# Patient Record
Sex: Male | Born: 1980 | Race: Black or African American | Hispanic: No | Marital: Single | Smoking: Never smoker
Health system: Southern US, Community
[De-identification: ages and names within clinical notes are randomized; demographics above are authoritative.]

## PROBLEM LIST (undated history)

## (undated) DIAGNOSIS — I10 Essential (primary) hypertension: Secondary | ICD-10-CM

---
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
## 2002-03-06 ENCOUNTER — Emergency Department (HOSPITAL_COMMUNITY): Admission: EM | Admit: 2002-03-06 | Discharge: 2002-03-06 | Payer: Self-pay | Admitting: Unknown Physician Specialty

## 2003-09-04 ENCOUNTER — Emergency Department (HOSPITAL_COMMUNITY): Admission: EM | Admit: 2003-09-04 | Discharge: 2003-09-04 | Payer: Self-pay | Admitting: Emergency Medicine

## 2003-11-02 ENCOUNTER — Emergency Department (HOSPITAL_COMMUNITY): Admission: EM | Admit: 2003-11-02 | Discharge: 2003-11-02 | Payer: Self-pay | Admitting: Emergency Medicine

## 2003-11-04 ENCOUNTER — Emergency Department (HOSPITAL_COMMUNITY): Admission: EM | Admit: 2003-11-04 | Discharge: 2003-11-04 | Payer: Self-pay | Admitting: Family Medicine

## 2007-04-29 ENCOUNTER — Emergency Department (HOSPITAL_COMMUNITY): Admission: EM | Admit: 2007-04-29 | Discharge: 2007-04-29 | Payer: Self-pay | Admitting: Emergency Medicine

## 2007-05-09 ENCOUNTER — Emergency Department (HOSPITAL_COMMUNITY): Admission: EM | Admit: 2007-05-09 | Discharge: 2007-05-09 | Payer: Self-pay | Admitting: Emergency Medicine

## 2007-05-16 ENCOUNTER — Emergency Department (HOSPITAL_COMMUNITY): Admission: EM | Admit: 2007-05-16 | Discharge: 2007-05-16 | Payer: Self-pay | Admitting: Emergency Medicine

## 2007-12-21 ENCOUNTER — Emergency Department (HOSPITAL_COMMUNITY): Admission: EM | Admit: 2007-12-21 | Discharge: 2007-12-21 | Payer: Self-pay | Admitting: Emergency Medicine

## 2008-08-09 ENCOUNTER — Emergency Department (HOSPITAL_COMMUNITY): Admission: EM | Admit: 2008-08-09 | Discharge: 2008-08-09 | Payer: Self-pay | Admitting: Emergency Medicine

## 2008-08-14 ENCOUNTER — Emergency Department (HOSPITAL_COMMUNITY): Admission: EM | Admit: 2008-08-14 | Discharge: 2008-08-14 | Payer: Self-pay | Admitting: Emergency Medicine

## 2008-08-23 ENCOUNTER — Emergency Department (HOSPITAL_COMMUNITY): Admission: EM | Admit: 2008-08-23 | Discharge: 2008-08-23 | Payer: Self-pay | Admitting: Emergency Medicine

## 2008-08-30 ENCOUNTER — Emergency Department (HOSPITAL_COMMUNITY): Admission: EM | Admit: 2008-08-30 | Discharge: 2008-08-30 | Payer: Self-pay | Admitting: Emergency Medicine

## 2010-09-16 ENCOUNTER — Emergency Department (HOSPITAL_COMMUNITY)
Admission: EM | Admit: 2010-09-16 | Discharge: 2010-09-16 | Disposition: A | Discharge status: Home / Self Care | Payer: No Typology Code available for payment source | Attending: Emergency Medicine | Admitting: Emergency Medicine

## 2010-09-16 DIAGNOSIS — R011 Cardiac murmur, unspecified: Secondary | ICD-10-CM | POA: Insufficient documentation

## 2010-09-16 DIAGNOSIS — M549 Dorsalgia, unspecified: Secondary | ICD-10-CM | POA: Insufficient documentation

## 2010-09-16 DIAGNOSIS — I10 Essential (primary) hypertension: Secondary | ICD-10-CM | POA: Insufficient documentation

## 2010-09-16 DIAGNOSIS — F319 Bipolar disorder, unspecified: Secondary | ICD-10-CM | POA: Insufficient documentation

## 2010-10-26 LAB — POCT I-STAT, CHEM 8
Calcium, Ion: 1.03 mmol/L — ABNORMAL LOW (ref 1.12–1.32)
HCT: 47 % (ref 39.0–52.0)
Hemoglobin: 16 g/dL (ref 13.0–17.0)
Sodium: 137 mEq/L (ref 135–145)
TCO2: 20 mmol/L (ref 0–100)
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
## 2010-11-27 NOTE — Discharge Summary (Signed)
[facility]  Patient:    Theodore Perez, Theodore Perez                      MRN: [id number] Adm. Date:  [id number] Disc. Date: 04/04/00 Attending:  Veneta Penton                           Discharge Summary  REASON FOR ADMISSION:  This 30 year old black male was admitted involuntarily after threatening to kill himself.  For further history of present illness, please see the patients psychiatric admission assessment.  PHYSICAL EXAMINATION:  His physical examination at the time of admission was entirely unremarkable.  LABORATORY EXAMINATION:  The patients laboratory examination was significant for a urine drug screen positive for marijuana.  A hepatic panel was unremarkable.  A UA was unremarkable.  CBC was within normal limits.  A metabolic panel was within normal limits.  GGT was 27.  Thyroid function tests are pending at the time of discharge.  The patient received no x-rays, special procedures, or consultations during the course of this hospitalization.  He sustained no complications during the course of his hospitalization.  HOSPITAL COURSE:  The patient rapidly adapted to unit routine, socializing well with both patients and staff.  He remained somewhat oppositional and defiant.  He was begun on a trial of Effexor XR and titrated up to a therapeutic dose.  He has had no side effects from the medication.  His affect and mood have improved.  He no longer appears to be depressed or irritable as he did on admission.  His concentration has also improved.  He has shown some improvement in impulse control.  He denies any homicidal or suicidal ideation. He is participating in all aspects of the therapeutic treatment program. Consequently it is felt that the patient has reached his maximum benefits of hospitalization and is ready for transition to a less restrictive alternative setting.  CONDITION ON DISCHARGE:  Improved.  DIAGNOSES: Axis I:    1. Major  depression, recurrent type, severe, without psychosis.            2. Conduct disorder.            3. Attention deficit hyperactivity disorder, combined type.            4. Cannabis abuse. Axis II:   Rule out personality disorder, not otherwise specified. Axis III:  None. Axis IV:   Severe. Axis V:    Code 10.  FURTHER EVALUATION AND TREATMENT RECOMMENDATIONS: 1. Patient is discharged to home. 2. Patient is discharged on unrestricted level of activity and a regular    diet. 3. The patient is discharged to outpatient, individual, and family [facility]    [facility], along with following up with his    outpatient psychiatrist.  Consequently I will sign off on the case at this    time.  MEDICATIONS:  The patient is discharged on Effexor XR 75 mg p.o. q.a.m. DD:  04/04/00 TD:  04/04/00 Job: [id number]

## 2011-02-10 ENCOUNTER — Emergency Department (HOSPITAL_COMMUNITY)
Admission: EM | Admit: 2011-02-10 | Discharge: 2011-02-10 | Disposition: A | Discharge status: Home / Self Care | Payer: Self-pay | Attending: Emergency Medicine | Admitting: Emergency Medicine

## 2011-02-10 DIAGNOSIS — L989 Disorder of the skin and subcutaneous tissue, unspecified: Secondary | ICD-10-CM | POA: Insufficient documentation

## 2011-02-10 DIAGNOSIS — I10 Essential (primary) hypertension: Secondary | ICD-10-CM | POA: Insufficient documentation

## 2011-02-10 DIAGNOSIS — M79609 Pain in unspecified limb: Secondary | ICD-10-CM | POA: Insufficient documentation

## 2011-02-10 DIAGNOSIS — R634 Abnormal weight loss: Secondary | ICD-10-CM | POA: Insufficient documentation

## 2011-02-10 DIAGNOSIS — R197 Diarrhea, unspecified: Secondary | ICD-10-CM | POA: Insufficient documentation

## 2011-02-10 DIAGNOSIS — G8929 Other chronic pain: Secondary | ICD-10-CM | POA: Insufficient documentation

## 2011-02-10 DIAGNOSIS — R209 Unspecified disturbances of skin sensation: Secondary | ICD-10-CM | POA: Insufficient documentation

## 2011-02-10 DIAGNOSIS — F319 Bipolar disorder, unspecified: Secondary | ICD-10-CM | POA: Insufficient documentation

## 2011-02-10 DIAGNOSIS — M549 Dorsalgia, unspecified: Secondary | ICD-10-CM | POA: Insufficient documentation

## 2011-02-10 LAB — DIFFERENTIAL
Basophils Absolute: 0 10*3/uL (ref 0.0–0.1)
Basophils Relative: 0 % (ref 0–1)
Lymphocytes Relative: 27 % (ref 12–46)
Lymphs Abs: 2.5 10*3/uL (ref 0.7–4.0)
Neutro Abs: 5.6 10*3/uL (ref 1.7–7.7)
Neutrophils Relative %: 60 % (ref 43–77)

## 2011-02-10 LAB — CBC
MCH: 28.6 pg (ref 26.0–34.0)
MCHC: 33.6 g/dL (ref 30.0–36.0)
MCV: 85.3 fL (ref 78.0–100.0)
Platelets: 233 10*3/uL (ref 150–400)
RDW: 13.7 % (ref 11.5–15.5)
WBC: 9.3 10*3/uL (ref 4.0–10.5)

## 2011-02-10 LAB — BASIC METABOLIC PANEL
BUN: 13 mg/dL (ref 6–23)
CO2: 29 mEq/L (ref 19–32)
Potassium: 4 mEq/L (ref 3.5–5.1)
Sodium: 139 mEq/L (ref 135–145)

## 2011-02-11 LAB — GIARDIA/CRYPTOSPORIDIUM SCREEN(EIA): Cryptosporidium Screen (EIA): NEGATIVE

## 2011-02-17 ENCOUNTER — Emergency Department (HOSPITAL_COMMUNITY)
Admission: EM | Admit: 2011-02-17 | Discharge: 2011-02-17 | Disposition: A | Discharge status: Home / Self Care | Payer: Self-pay | Attending: Emergency Medicine | Admitting: Emergency Medicine

## 2011-02-17 DIAGNOSIS — Z23 Encounter for immunization: Secondary | ICD-10-CM | POA: Insufficient documentation

## 2011-03-19 ENCOUNTER — Inpatient Hospital Stay (INDEPENDENT_AMBULATORY_CARE_PROVIDER_SITE_OTHER)
Admission: RE | Admit: 2011-03-19 | Discharge: 2011-03-19 | Disposition: A | Discharge status: Home / Self Care | Payer: Self-pay | Source: Ambulatory Visit | Attending: Family Medicine | Admitting: Family Medicine

## 2011-03-19 DIAGNOSIS — N342 Other urethritis: Secondary | ICD-10-CM

## 2011-03-20 LAB — GC/CHLAMYDIA PROBE AMP, GENITAL
Chlamydia, DNA Probe: NEGATIVE
GC Probe Amp, Genital: NEGATIVE

## 2012-02-12 ENCOUNTER — Emergency Department (HOSPITAL_COMMUNITY)
Admission: EM | Admit: 2012-02-12 | Discharge: 2012-02-12 | Disposition: A | Discharge status: Home / Self Care | Payer: Medicare Other | Attending: Emergency Medicine | Admitting: Emergency Medicine

## 2012-02-12 ENCOUNTER — Encounter (HOSPITAL_COMMUNITY): Payer: Self-pay | Admitting: *Deleted

## 2012-02-12 DIAGNOSIS — R070 Pain in throat: Secondary | ICD-10-CM | POA: Insufficient documentation

## 2012-02-12 DIAGNOSIS — B9789 Other viral agents as the cause of diseases classified elsewhere: Secondary | ICD-10-CM | POA: Insufficient documentation

## 2012-02-12 DIAGNOSIS — I889 Nonspecific lymphadenitis, unspecified: Secondary | ICD-10-CM

## 2012-02-12 DIAGNOSIS — J028 Acute pharyngitis due to other specified organisms: Secondary | ICD-10-CM

## 2012-02-12 MED ORDER — IBUPROFEN 200 MG PO TABS
600.0000 mg | ORAL_TABLET | Freq: Once | ORAL | Status: AC
  Administered 2012-02-12: 600 mg via ORAL
  Filled 2012-02-12: qty 3

## 2012-02-12 NOTE — ED Notes (Signed)
Pt felt sinus pressure and general malaise on Wed.  Fri am he felt swelling and pain 6/10 to L neck.  PT afebrile.

## 2012-02-12 NOTE — ED Notes (Signed)
Pt reports sore throat, sinus pressure, and weakness x4 days, increase pain w/swallowing
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
## 2012-02-13 NOTE — ED Provider Notes (Signed)
Medical screening examination/treatment/procedure(s) were performed by non-physician practitioner and as supervising physician I was immediately available for consultation/collaboration.  Angy Swearengin, MD 02/13/12 1506 

## 2012-09-22 ENCOUNTER — Emergency Department (INDEPENDENT_AMBULATORY_CARE_PROVIDER_SITE_OTHER): Payer: Medicaid Other

## 2012-09-22 ENCOUNTER — Encounter (HOSPITAL_COMMUNITY): Payer: Self-pay | Admitting: *Deleted

## 2012-09-22 ENCOUNTER — Emergency Department (HOSPITAL_COMMUNITY)
Admission: EM | Admit: 2012-09-22 | Discharge: 2012-09-22 | Disposition: A | Discharge status: Home / Self Care | Payer: Medicare Other | Attending: Emergency Medicine | Admitting: Emergency Medicine

## 2012-09-22 ENCOUNTER — Emergency Department (HOSPITAL_COMMUNITY)
Admission: EM | Admit: 2012-09-22 | Discharge: 2012-09-22 | Disposition: A | Discharge status: Nursing Home (Includes ICF) | Payer: Medicaid Other | Source: Home / Self Care | Attending: Emergency Medicine | Admitting: Emergency Medicine

## 2012-09-22 DIAGNOSIS — I208 Other forms of angina pectoris: Secondary | ICD-10-CM

## 2012-09-22 DIAGNOSIS — R0789 Other chest pain: Secondary | ICD-10-CM | POA: Insufficient documentation

## 2012-09-22 LAB — CBC
Hemoglobin: 14.9 g/dL (ref 13.0–17.0)
Platelets: 248 10*3/uL (ref 150–400)
RBC: 5.05 MIL/uL (ref 4.22–5.81)
RDW: 13 % (ref 11.5–15.5)
WBC: 9.4 10*3/uL (ref 4.0–10.5)

## 2012-09-22 LAB — BASIC METABOLIC PANEL
CO2: 27 mEq/L (ref 19–32)
Calcium: 9.7 mg/dL (ref 8.4–10.5)
Creatinine, Ser: 1.11 mg/dL (ref 0.50–1.35)
GFR calc Af Amer: >90 mL/min (ref >90)
Glucose, Bld: 93 mg/dL (ref 70–99)
Potassium: 4.3 mEq/L (ref 3.5–5.1)

## 2012-09-22 LAB — POCT I-STAT, CHEM 8
BUN: 14 mg/dL (ref 6–23)
Calcium, Ion: 1.25 mmol/L — ABNORMAL HIGH (ref 1.12–1.23)
Chloride: 104 mEq/L (ref 96–112)
Glucose, Bld: 98 mg/dL (ref 70–99)
HCT: 48 % (ref 39.0–52.0)
Hemoglobin: 16.3 g/dL (ref 13.0–17.0)
Sodium: 139 mEq/L (ref 135–145)
TCO2: 27 mmol/L (ref 0–100)

## 2012-09-22 NOTE — ED Notes (Signed)
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

## 2012-09-22 NOTE — ED Provider Notes (Signed)
Chief Complaint:   Chief Complaint  Patient presents with  . 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 The pain is upper sternal and described as a pressure or tightness rated 1-2/10 in intensity. It does not radiate. It lasts for seconds at a time. It occurs only with exertion such as walking or so she walking up a slight incline. It goes away with rest. The shortness of breath also comes on with exertion and goes away with rest. He's felt dizzy and lightheaded. He denies any diaphoresis, nausea, or vomiting. He did have some abdominal pain and diarrhea last week but now this has gone away. He has a history of a heart murmur as a child but has not been checked for this recently. He denies any coughing, wheezing, palpitations, rapid heartbeat, syncope, or ankle edema. He's had migraine type headaches for years. These typically occur in the morning. They have been little bit more frequent recently. They're not occurring everyday. He also has had hypertension since he was 16. He has not taken any medication in about 13 years. He does not have any history of diabetes, elevated cholesterol, family history of heart disease, or cigarette smoking.  Review of Systems:  Other than noted above, the patient denies any of the following symptoms. Systemic:  No fever, chills, sweats, or fatigue. ENT:  No nasal congestion, rhinorrhea, or sore throat. Pulmonary:  No cough, wheezing, shortness of breath, sputum production, hemoptysis. Cardiac:  No palpitations, rapid heartbeat, dizziness, presyncope or syncope. GI:  No abdominal pain, heartburn, nausea, or vomiting. Ext:  No leg pain or swelling.  PMFSH:  Past medical history, family history, social history, meds, and allergies were reviewed and updated as needed. He is allergic to aspirin and penicillin. He takes risperidone  Zoloft. Medical problems include high blood pressure, migraines, OCD, and ADD. He does not use alcohol or tobacco.  Physical Exam:   Vital signs:  BP 159/99  Pulse 70  Temp(Src) 98.9 F (37.2 C) (Oral)  Resp 20  SpO2 100% Gen:  Alert, oriented, in no distress, skin warm and dry. Eye:  PERRL, lids and conjunctivas normal.  Sclera non-icteric. ENT:  Mucous membranes moist, pharynx clear. Neck:  Supple, no adenopathy or tenderness.  No JVD. Lungs:  Clear to auscultation, no wheezes, rales or rhonchi.  No respiratory distress. Heart:  Regular rhythm.  No gallops, murmers, clicks or rubs. Chest:  No chest wall tenderness. Abdomen:  Soft, nontender, no organomegaly or mass.  Bowel sounds normal.  No pulsatile abdominal mass or bruit. Ext:  No edema.  No calf tenderness and Homann's sign negative.  Pulses full and equal. Skin:  Warm and dry.  No rash.  Labs:   Results for orders placed during the hospital encounter of 09/22/12  POCT I-STAT, CHEM 8      Result Value Perez   Sodium 139  135 - 145 mEq/L   Potassium 4.2  3.5 - 5.1 mEq/L   Chloride 104  96 - 112 mEq/L   BUN 14  6 - 23 mg/dL   Creatinine, Ser 1.61  0.50 - 1.35 mg/dL   Glucose, Bld 98  70 - 99 mg/dL   Calcium, Ion 0.96 (*) 1.12 - 1.23 mmol/L   TCO2 27  0 - 100 mmol/L   Hemoglobin 16.3  13.0 - 17.0 g/dL  HCT 48.0  39.0 - 52.0 %     Radiology:  Dg Chest 2 View  09/22/2012  *RADIOLOGY REPORT*  Clinical Data: Intermittent chest pain for 3 weeks.  CHEST - 2 VIEW  Comparison: None.  Findings: Lungs are clear.  Heart size is normal.  No pneumothorax or pleural fluid.  No bony abnormality.  IMPRESSION: Normal chest.   Original Report Authenticated By: Holley Dexter, M.D.    I reviewed the images independently and personally and concur with the radiologist's findings.  EKG:   Date: 09/22/2012  Rate: 53  Rhythm: sinus bradycardia  QRS Axis: normal  Intervals: normal  ST/T Wave abnormalities: ST elevations anteriorly   Conduction Disutrbances:none  Narrative Interpretation: Sinus bradycardia, septal infarct, age undetermined with QS waves in leads V1 through V3, poor R-wave progression, and ST segment elevation in leads V1 through V3.  Old EKG Reviewed: none available  Assessment:  The encounter diagnosis was Angina effort.  He has anginal type chest pain with upper sternal tightness and pressure that occur with exertion and go away with rest. This is a surgeon in the shortness of breath and some dizziness and lightheadedness. 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 He is not having pain right now and is stable, though she is appropriate to be transferred by shuttle.   Plan:   1.  The following meds were prescribed:   New Prescriptions   No medications on file   2.  The patient was transferred via shuttle to the emergency department for further evaluation.   Reuben Likes, MD 09/22/12 [phone]

## 2012-09-22 NOTE — ED Notes (Signed)
[facility] sent [facility] for further eval of upper chest pains with exertion over past two weeks. ekg done at [facility]. No acute distress noted at this time, denies currently having any pain.

## 2013-09-03 ENCOUNTER — Encounter (HOSPITAL_COMMUNITY): Payer: Self-pay | Admitting: Emergency Medicine

## 2013-09-03 DIAGNOSIS — R197 Diarrhea, unspecified: Secondary | ICD-10-CM | POA: Insufficient documentation

## 2013-09-03 DIAGNOSIS — Z79899 Other long term (current) drug therapy: Secondary | ICD-10-CM | POA: Insufficient documentation

## 2013-09-03 DIAGNOSIS — B9789 Other viral agents as the cause of diseases classified elsewhere: Secondary | ICD-10-CM | POA: Insufficient documentation

## 2013-09-03 DIAGNOSIS — Z880 Allergy status to penicillin: Secondary | ICD-10-CM | POA: Insufficient documentation

## 2013-09-03 LAB — CBC WITH DIFFERENTIAL/PLATELET
BASOS ABS: 0 10*3/uL (ref 0.0–0.1)
BASOS PCT: 0 % (ref 0–1)
Eosinophils Absolute: 0.1 10*3/uL (ref 0.0–0.7)
Eosinophils Relative: 1 % (ref 0–5)
HCT: 43.7 % (ref 39.0–52.0)
Hemoglobin: 15.3 g/dL (ref 13.0–17.0)
LYMPHS PCT: 15 % (ref 12–46)
Lymphs Abs: 1.3 10*3/uL (ref 0.7–4.0)
MCH: 29.9 pg (ref 26.0–34.0)
MCHC: 35 g/dL (ref 30.0–36.0)
MCV: 85.4 fL (ref 78.0–100.0)
Monocytes Absolute: 1 10*3/uL (ref 0.1–1.0)
Monocytes Relative: 10 % (ref 3–12)
NEUTROS ABS: 6.9 10*3/uL (ref 1.7–7.7)
NEUTROS PCT: 74 % (ref 43–77)
Platelets: 148 10*3/uL — ABNORMAL LOW (ref 150–400)
RBC: 5.12 MIL/uL (ref 4.22–5.81)
RDW: 13 % (ref 11.5–15.5)
WBC: 9.3 10*3/uL (ref 4.0–10.5)

## 2013-09-03 LAB — BASIC METABOLIC PANEL
BUN: 16 mg/dL (ref 6–23)
CHLORIDE: 98 meq/L (ref 96–112)
CO2: 24 mEq/L (ref 19–32)
Calcium: 9.3 mg/dL (ref 8.4–10.5)
Creatinine, Ser: 1.11 mg/dL (ref 0.50–1.35)
GFR calc non Af Amer: 88 mL/min — ABNORMAL LOW (ref >90)
Glucose, Bld: 99 mg/dL (ref 70–99)
POTASSIUM: 3.8 meq/L (ref 3.7–5.3)
SODIUM: 137 meq/L (ref 137–147)

## 2013-09-03 MED ORDER — ONDANSETRON 4 MG PO TBDP
8.0000 mg | ORAL_TABLET | Freq: Once | ORAL | Status: AC
  Administered 2013-09-03: 8 mg via ORAL
  Filled 2013-09-03: qty 2

## 2013-09-03 NOTE — ED Notes (Signed)
Presents with nausea, vomiting, chills and inabilitly to hold down food. Began Friday. Reports before that "i felt like I was coming down with something, I was real tired and drowsy and had flulike symptoms" reports having diarrhea after eating.

## 2013-09-04 ENCOUNTER — Emergency Department (HOSPITAL_COMMUNITY)
Admission: EM | Admit: 2013-09-04 | Discharge: 2013-09-04 | Disposition: A | Discharge status: Home / Self Care | Payer: Medicare FFS | Attending: Emergency Medicine | Admitting: Emergency Medicine

## 2013-09-04 DIAGNOSIS — B349 Viral infection, unspecified: Secondary | ICD-10-CM

## 2013-09-04 DIAGNOSIS — R197 Diarrhea, unspecified: Secondary | ICD-10-CM

## 2013-09-04 MED ORDER — LOPERAMIDE HCL 2 MG PO CAPS
4.0000 mg | ORAL_CAPSULE | Freq: Once | ORAL | Status: AC
  Administered 2013-09-04: 4 mg via ORAL
  Filled 2013-09-04: qty 2

## 2013-09-04 MED ORDER — SODIUM CHLORIDE 0.9 % IV BOLUS (SEPSIS)
1000.0000 mL | Freq: Once | INTRAVENOUS | Status: AC
  Administered 2013-09-04: 1000 mL via INTRAVENOUS

## 2013-09-04 MED ORDER — LOPERAMIDE HCL 2 MG PO CAPS: 2.0000 mg | ORAL_CAPSULE | ORAL | Status: DC | PRN

## 2013-09-04 MED ORDER — NAPROXEN 500 MG PO TABS: 500.0000 mg | ORAL_TABLET | Freq: Two times a day (BID) | ORAL | Status: DC

## 2013-09-04 MED ORDER — ONDANSETRON 4 MG PO TBDP: 4.0000 mg | ORAL_TABLET | Freq: Three times a day (TID) | ORAL | Status: DC | PRN

## 2013-09-04 MED ORDER — KETOROLAC TROMETHAMINE 30 MG/ML IJ SOLN
30.0000 mg | Freq: Once | INTRAMUSCULAR | Status: AC
  Administered 2013-09-04: 30 mg via INTRAVENOUS
  Filled 2013-09-04: qty 1

## 2013-09-04 NOTE — ED Provider Notes (Signed)
CSN: [id number]     Arrival date & time 09/03/13  2216 History   First MD Initiated Contact with Patient 09/04/13 [phone]     Chief Complaint  Patient presents with  . Abdominal Pain     (Consider location/radiation/quality/duration/timing/severity/associated sxs/prior Treatment) HPI Comments: 33 year old male, no significant past medical history presents with approximately 3 days of diarrhea. He states that initially he developed nausea vomiting fevers and chills and progress to have watery diarrhea. He states he is having multiple episodes of watery diarrhea for the last couple of days, he feels abdominal bloating and cramping, increased tiredness and drowsiness and some body aches. He denies subjective fevers, denies rash, denies cough or shortness of breath. He has no known sick contacts, has not been on any recent antibiotics.  Patient is a 33 y.o. male presenting with abdominal pain. The history is provided by the patient and a relative.  Abdominal Pain   History reviewed. No pertinent past medical history. History reviewed. No pertinent past surgical history. History reviewed. No pertinent family history. History  Substance Use Topics  . Smoking status: Never Smoker   . Smokeless tobacco: Not on file  . Alcohol Use: No     Comment: occasionally    Review of Systems  Gastrointestinal: Positive for abdominal pain.  All other systems reviewed and are negative.      Allergies  Penicillins  Home Medications   Current Outpatient Rx  Name  Route  Sig  Dispense  Refill  . acetaminophen (TYLENOL) 325 MG tablet   Oral   Take 650 mg by mouth every 6 (six) hours as needed. For pain         . RisperiDONE (RISPERDAL PO)   Oral   Take 1 tablet by mouth 2 (two) times daily.         . sertraline (ZOLOFT) 100 MG tablet   Oral   Take 100 mg by mouth at bedtime.          Marland Kitchen loperamide (IMODIUM) 2 MG capsule   Oral   Take 1 capsule (2 mg total) by mouth as needed for  diarrhea or loose stools.   30 capsule   0   . naproxen (NAPROSYN) 500 MG tablet   Oral   Take 1 tablet (500 mg total) by mouth 2 (two) times daily with a meal.   30 tablet   0   . ondansetron (ZOFRAN ODT) 4 MG disintegrating tablet   Oral   Take 1 tablet (4 mg total) by mouth every 8 (eight) hours as needed for nausea.   10 tablet   0    BP 131/91  Pulse 65  Temp(Src) 99.1 F (37.3 C) (Oral)  Resp 18  Ht 5\' 8"  (1.727 m)  Wt 231 lb 3 oz (104.866 kg)  BMI 35.16 kg/m2  SpO2 96% Physical Exam  Nursing note and vitals reviewed. Constitutional: He appears well-developed and well-nourished. No distress.  HENT:  Head: Normocephalic and atraumatic.  Mouth/Throat: No oropharyngeal exudate.  Mucous membranes appear dry  Eyes: Conjunctivae and EOM are normal. Pupils are equal, round, and reactive to light. Right eye exhibits no discharge. Left eye exhibits no discharge. No scleral icterus.  Neck: Normal range of motion. Neck supple. No JVD present. No thyromegaly present.  Cardiovascular: Normal rate, regular rhythm, normal heart sounds and intact distal pulses.  Exam reveals no gallop and no friction rub.   No murmur heard. Pulmonary/Chest: Effort normal and breath sounds normal. No respiratory distress.  He has no wheezes. He has no rales.  Abdominal: Soft. He exhibits no distension and no mass. There is no tenderness.  Increased bowel sounds. Nontender abdomen, no guarding  Musculoskeletal: Normal range of motion. He exhibits no edema and no tenderness.  Lymphadenopathy:    He has no cervical adenopathy.  Neurological: He is alert. Coordination normal.  Skin: Skin is warm and dry. No rash noted. No erythema.  Psychiatric: He has a normal mood and affect. His behavior is normal.    ED Course  Procedures (including critical care time) Labs Review Labs Reviewed  CBC WITH DIFFERENTIAL - Abnormal; Notable for the following:    Platelets 148 (*)    All other components within  normal limits  BASIC METABOLIC PANEL - Abnormal; Notable for the following:    GFR calc non Af Amer 88 (*)    All other components within normal limits   Imaging Review No results found.  EKG Interpretation   None       MDM   Final diagnoses:  Viral illness  Diarrhea    The patient does not appear in distress however he does have signs of dehydration, he is borderline tachycardic and has ongoing fluid losses from his diarrhea. Laboratory workup shows no leukocytosis, no anemia, no renal dysfunction and no electrolyte abnormalities. We'll proceed with fluid hydration and antidiarrheals. Doubt significant infectious disease, doubt C. differential  After IV fluids and medications the patient's symptoms have improved, he remains hemodynamically stable and appears well. He is informed of his results, will go home with nausea and pain medication, antidiarrheal medications with encouragement to pursue oral hydration. He is in agreement with the plan  Meds given in ED:  Medications  ondansetron (ZOFRAN-ODT) disintegrating tablet 8 mg (8 mg Oral Given 09/03/13 2249)  sodium chloride 0.9 % bolus 1,000 mL (1,000 mLs Intravenous New Bag/Given 09/04/13 0439)  ketorolac (TORADOL) 30 MG/ML injection 30 mg (30 mg Intravenous Given 09/04/13 0440)  loperamide (IMODIUM) capsule 4 mg (4 mg Oral Given 09/04/13 0440)    New Prescriptions   LOPERAMIDE (IMODIUM) 2 MG CAPSULE    Take 1 capsule (2 mg total) by mouth as needed for diarrhea or loose stools.   NAPROXEN (NAPROSYN) 500 MG TABLET    Take 1 tablet (500 mg total) by mouth 2 (two) times daily with a meal.   ONDANSETRON (ZOFRAN ODT) 4 MG DISINTEGRATING TABLET    Take 1 tablet (4 mg total) by mouth every 8 (eight) hours as needed for nausea.      Vida RollerBrian D Mayan Kloepfer, MD 09/04/13 [phone]

## 2013-09-04 NOTE — Discharge Instructions (Signed)
Please call your doctor for a followup appointment within 24-48 hours. When you talk to your doctor please let them know that you were seen in the emergency department and have them acquire all of your records so that they can discuss the findings with you and formulate a treatment plan to fully care for your new and ongoing problems.  zofran for nausea  Naprosyn for pain  Lomotil for diarrhea   Emergency Department Resource Guide 1) Find a Doctor and Pay Out of Pocket Although you won't have to find out who is covered by your insurance plan, it is a good idea to ask around and get recommendations. You will then need to call the office and see if the doctor you have chosen will accept you as a new patient and what types of options they offer for patients who are self-pay. Some doctors offer discounts or will set up payment plans for their patients who do not have insurance, but you will need to ask so you aren't surprised when you get to your appointment.  2) Contact Your Local Health Department Not all health departments have doctors that can see patients for sick visits, but many do, so it is worth a call to see if yours does. If you don't know where your local health department is, you can check in your phone book. The CDC also has a tool to help you locate your state's health department, and many state websites also have listings of all of their local health departments.  3) Find a Walk-in Clinic If your illness is not likely to be very severe or complicated, you may want to try a walk in clinic. These are popping up all over the country in pharmacies, drugstores, and shopping centers. They're usually staffed by nurse practitioners or physician assistants that have been trained to treat common illnesses and complaints. They're usually fairly quick and inexpensive. However, if you have serious medical issues or chronic medical problems, these are probably not your best option.  No Primary Care  Doctor: - Call Health Connect at  [phone] - they can help you locate a primary care doctor that  accepts your insurance, provides certain services, etc. - Physician Referral Service- [phone]  Chronic Pain Problems: Organization         Address  Phone   Notes  [facility]  [phone] Patients need to be referred by their primary care doctor.   Medication Assistance: Organization         Address  Phone   Notes  [facility] Medication [facility] [address] [place], [place] [address] [phone] --Must be a resident of [place] -- Must have NO insurance coverage whatsoever (no Medicaid/ Medicare, etc.) -- The pt. MUST have a primary care doctor that directs their care regularly and follows them in the community   MedAssist  [phone]   [employer]  [phone]    Agencies that provide inexpensive medical care: Organization         Address  Phone   Notes  [facility]  [phone]   [facility]    [phone]   [facility] [address] [place], [place] [address] [phone]   [facility] [address], [place] [phone]   Planned Parenthood    [phone]   [facility]    [phone]   Community Health  and [address], [place] Phone:  [phone], Fax:  [phone] Hours of Operation:  9 am - 6 pm, M-F.  Also accepts Medicaid/Medicare and self-pay.  [facility] for [place], [address], [place] Phone: [phone], Fax: [phone]. Hours of Operation:  8:30 am - 5:30 pm, M-F.  Also accepts Medicaid and self-pay.  [facility] [place] [address] [place] Phone: [phone]   [place], [place], [place] [phone], Ext. [phone] Mondays & Thursdays: 7-9 AM.  First 15 patients are seen on a first  come, first serve basis.    [place] Providers:  Organization         Address  Phone   Notes  [facility] [address], [place] [phone] Also accepts self-pay patients.  [facility] [address], [place]  [phone]   [place], [address], [place] [phone]   [facility] [address] [place] [phone]   Lucianne Lei [address], [place]   [phone] Only accepts [place] Access [place] patients after they have their name applied to their card.   Self-Pay (no insurance) in [facility]:  Organization         Address  Phone   Notes  Sickle Cell Patients, [facility] [place] [phone]   [facility] [place] [phone]   [facility] [place]  [place], [place], [place] [phone]   [facility]/Dr. Osei-Bonsu  [address] [place] or [address], [place] [phone] Phone number for both [place] and [place] locations is the same.  [facility] [address] [place] [phone]   [facility] [address], [place] or [address] [phone]   [facility] [address], [place] [phone], phone; [phone], fax Sees patients 1st and 3rd Saturday of every month.  Must not qualify for public or private insurance (i.e. Medicaid, Medicare, [place] Health Choice, Veterans' Benefits)  Household income should be no more than 200% of the poverty level The clinic cannot treat you if you are pregnant or think you are pregnant  Sexually transmitted diseases are not treated at the clinic.    Dental Care: Organization          Address  Phone  Notes  [facility] [place] [phone] Accepts children up to age 79 who are enrolled in [place] or [place]; pregnant women with a Medicaid card; and children who have applied for Medicaid or [place] Health Choice, but were declined, whose parents can pay a reduced fee at time of service.  [facility] Department of [facility]  [address], [place] [phone] Accepts children up to age 42 who are enrolled in [place] or [place]; pregnant women with a Medicaid card; and children who have applied for Medicaid or [place] Health Choice, but were declined, whose parents can pay a reduced fee at time of service.  [facility]  [address]  Mardene Speak [phone] Patients are seen by appointment only. Walk-ins are not accepted. [place] will see patients 60 years of age and older. Monday - Tuesday (8am-5pm) Most Wednesdays (8:30-5pm) $30 per visit, cash only  [facility] Adult Dental Access PROGRAM  [address], [facility] [phone] Patients are seen by appointment only. Walk-ins are not accepted. Clarissa will see patients 9 years of age and older. One Wednesday Evening (Monthly: Volunteer Based).  $30 per visit, cash only  [place]  [phone] for adults; Children under age 8, call Graduate Pediatric Dentistry at [phone]. Children aged 87-14, please call [phone] to request a pediatric application.  Dental services are provided in all areas of dental care including fillings, crowns and bridges, complete and partial dentures, implants, gum treatment, root canals, and extractions. Preventive care is also provided. Treatment is provided to both adults and children. Patients are selected via a lottery and there is often a waiting list.   [facility] [address], [place]  [phone] [email or web address]   [facility] [address] [place], [place] [phone], Ext. [phone] Second and Fourth Thursday of each month, opens at 6:30 AM; Clinic ends at 9 AM.  Patients are seen on a first-come first-served basis, and a limited number are seen during each clinic.   [facility]  [address] Hillard Danker [place], [place] [phone]   Eligibility Requirements You must have lived in [place], [place], or [place] counties for at least the last three months.   You cannot be eligible for state or federal sponsored [employer], including [employer], [place], or [employer].   You generally cannot be eligible for healthcare insurance through your employer.    How to apply: Eligibility screenings are held every Tuesday and Wednesday afternoon from 1:00 pm until 4:00 pm. You do not need an appointment for the interview!  [facility] [address] [place], [place]   [place]  [place] Department  [place]  [phone]    Behavioral Health Resources in the Community: Intensive Outpatient Programs Organization         Address  Phone  Notes  [facility]. [address], [place], [place] [phone]   [facility] Outpatient [address], [place], [place]   ADS: Alcohol & Drug Svcs [address], [place], [place]   [place] [address]  [place], [place] or [phone]   Substance Abuse Resources Organization         Address  Phone  Notes  Alcohol and Drug Services  [phone]   [place]  [phone]   [facility] [place]   Chinita Pester  [phone]   Residential & Outpatient Substance Abuse Program  [phone]   Psychological  Services Organization         Address  Phone  Notes  [facility]  [place]  [phone]   [place] [address] [place] or [phone]    Mobile Crisis Teams Organization         Address  Phone  Notes  [facility], Mobile Crisis Care Unit  [phone]   Assertive Psychotherapeutic Services  [address] [place], [place]   [place]  [place] [phone]    Self-Help/Support Groups Organization         Address  Phone             Notes  [facility] of [place] - variety of support groups  [place] Call for more information  Narcotics Anonymous (NA), Caring [facility] [address], [employer] [place]  2 meetings at this location   Special educational needs teacher         Address  Phone  Notes  [facility] Residential Treatment [place],    [place]  [phone]   [facility]  [address] [place] [id number], [employer], [place]   [place], [place] [phone] Admissions: 8am-3pm M-F  Incentives Substance [place] [address],    [place], [place] [phone]   [facility] [address] [place], [place], [place]   [facility] [address],  [place], [place]   [facility] Programs - Intensive Outpatient [address], [place], [place]   [facility] ([place].) [place] [address].,  [place], [place] [phone] or [phone]   [facility]idential Treatment Services (RTS) [address] [place], [place] Accepts Medicaid  [facility] [place] [address]  [place] [phone] Substance Abuse/Addiction Treatment   [facility] Organization         Address  Phone  Notes  [facility]  [phone]   Domenic Schwab, PhD [address] Arlis Porta [place], [place]   [phone] or [phone]   [place], [place] [phone]   [facility] [address] [place], [place] [phone] Insurance/Medicaid/sponsorship through [facility] and Families [address]                                    [place], [place] [phone] [place] [address], [place] [phone]    Dr. Adele Schilder  [phone]   [facility] Dept. 1) [address], [place] 2) [place] 3)  [place] [address], [place] [phone]  [phone]   [place] [phone] or [phone] (After Hours)

## 2014-03-23 ENCOUNTER — Encounter (HOSPITAL_COMMUNITY): Payer: Self-pay | Admitting: Emergency Medicine

## 2014-03-23 ENCOUNTER — Emergency Department (HOSPITAL_COMMUNITY)
Admission: EM | Admit: 2014-03-23 | Discharge: 2014-03-23 | Disposition: A | Discharge status: Home / Self Care | Payer: Medicare Other | Attending: Emergency Medicine | Admitting: Emergency Medicine

## 2014-03-23 DIAGNOSIS — Z880 Allergy status to penicillin: Secondary | ICD-10-CM | POA: Insufficient documentation

## 2014-03-23 DIAGNOSIS — Z791 Long term (current) use of non-steroidal anti-inflammatories (NSAID): Secondary | ICD-10-CM | POA: Insufficient documentation

## 2014-03-23 DIAGNOSIS — Z79899 Other long term (current) drug therapy: Secondary | ICD-10-CM | POA: Insufficient documentation

## 2014-03-23 DIAGNOSIS — I10 Essential (primary) hypertension: Secondary | ICD-10-CM

## 2014-03-23 HISTORY — DX: Essential (primary) hypertension: I10

## 2014-03-23 MED ORDER — LORAZEPAM 1 MG PO TABS: 1.0000 mg | ORAL_TABLET | Freq: Once | ORAL | Status: DC

## 2014-03-23 MED ORDER — HYDROCHLOROTHIAZIDE 25 MG PO TABS
25.0000 mg | ORAL_TABLET | Freq: Every day | ORAL | Status: DC
  Administered 2014-03-23: 25 mg via ORAL
  Filled 2014-03-23: qty 1

## 2014-03-23 MED ORDER — HYDROCHLOROTHIAZIDE 25 MG PO TABS: 25.0000 mg | ORAL_TABLET | Freq: Every day | ORAL | Status: DC

## 2014-03-23 MED ORDER — LORAZEPAM 0.5 MG PO TABS: 0.5000 mg | ORAL_TABLET | Freq: Once | ORAL | Status: DC

## 2014-03-23 NOTE — ED Provider Notes (Signed)
Medical screening examination/treatment/procedure(s) were performed by non-physician practitioner and as supervising physician I was immediately available for consultation/collaboration.   EKG Interpretation None        Glynn Octave, MD 03/23/14 1616

## 2014-03-23 NOTE — ED Notes (Signed)
Patient discharged with instructions and he verbalized an understanding, NAD noted at the time of D/C

## 2014-03-23 NOTE — ED Provider Notes (Signed)
CSN: [id number]     Arrival date & time 03/23/14  8295 History  This chart was scribed for a non-physician practitioner, Arthor Captain, PA-C working with Glynn Octave, MD by [place] Peace, ED Scribe. The patient [facility] in [facility]. The patient's care was started at 11:34 AM.     Chief Complaint  Patient presents with  . Hypertension      Patient is a 33 y.o. male presenting with hypertension. The history is provided by the patient. No language interpreter was used.  Hypertension This is a recurrent problem. The current episode started 3 to 5 hours ago. Associated symptoms include headaches. Pertinent negatives include no chest pain, no abdominal pain and no shortness of breath. Nothing relieves the symptoms. He has tried acetaminophen for the symptoms.   HPI Comments: Theodore Perez is a 33 y.o. male who presents to the Emergency Department complaining of hypertension with associated severe headache. Pt states that he knew he had hypertension when he woke up to a massive headache this morning and knew it had to be related to his BP.Pt states that he woke up this morning feeling like he got hit by a train. He reports history of hypertension and states that it has been getting progressively worse recently. He reports taking Tylenol without relief. He denies SOB or CP. He further denies taking any supplements, pre-workout, illegal drugs, tobacco, or alcohol consumption. He reports that he only drinks water, juice and milk. Pt states that he does not have a PCP but is in the process of trying to get one.   Past Medical History  Diagnosis Date  . Hypertension    History reviewed. No pertinent past surgical history. History reviewed. No pertinent family history. History  Substance Use Topics  . Smoking status: Never Smoker   . Smokeless tobacco: Not on file  . Alcohol Use: No     Comment: occasionally    Review of Systems  Respiratory: Negative for shortness of breath.    Cardiovascular: Negative for chest pain.       Positive for hypertension.   Gastrointestinal: Negative for abdominal pain.  Neurological: Positive for headaches.  Psychiatric/Behavioral: Positive for agitation.      Allergies  Penicillins  Home Medications   Prior to Admission medications   Medication Sig Start Date End Date Taking? Authorizing Provider  acetaminophen (TYLENOL) 325 MG tablet Take 650 mg by mouth every 6 (six) hours as needed. For pain    Historical Provider, MD  loperamide (IMODIUM) 2 MG capsule Take 1 capsule (2 mg total) by mouth as needed for diarrhea or loose stools. 09/04/13   Vida Roller, MD  naproxen (NAPROSYN) 500 MG tablet Take 1 tablet (500 mg total) by mouth 2 (two) times daily with a meal. 09/04/13   Vida Roller, MD  ondansetron (ZOFRAN ODT) 4 MG disintegrating tablet Take 1 tablet (4 mg total) by mouth every 8 (eight) hours as needed for nausea. 09/04/13   Vida Roller, MD  RisperiDONE (RISPERDAL PO) Take 1 tablet by mouth 2 (two) times daily.    Historical Provider, MD  sertraline (ZOLOFT) 100 MG tablet Take 100 mg by mouth at bedtime.  08/18/13   Historical Provider, MD   BP 169/106  Pulse 74  Temp(Src) 98.1 F (36.7 C) (Oral)  Resp 22  SpO2 98% Physical Exam  Nursing note and vitals reviewed. Constitutional: He is oriented to person, place, and time. He appears well-developed and well-nourished. No distress.  HENT:  Head: Normocephalic and atraumatic.  Eyes: Conjunctivae and EOM are normal.  Neck: Neck supple. No tracheal deviation present.  Cardiovascular: Normal rate.   Pulmonary/Chest: Effort normal. No respiratory distress.  Musculoskeletal: Normal range of motion.  Neurological: He is alert and oriented to person, place, and time.  Skin: Skin is warm and dry.  Psychiatric: He has a normal mood and affect. His behavior is normal.    ED Course  Procedures (including critical care time) Labs Review Labs Reviewed - No data to  display  Results for orders placed during the hospital encounter of 09/04/13  CBC WITH DIFFERENTIAL      Result Value Ref Range   WBC 9.3  4.0 - 10.5 K/uL   RBC 5.12  4.22 - 5.81 MIL/uL   Hemoglobin 15.3  13.0 - 17.0 g/dL   HCT 16.1  09.6 - 04.5 %   MCV 85.4  78.0 - 100.0 fL   MCH 29.9  26.0 - 34.0 pg   MCHC 35.0  30.0 - 36.0 g/dL   RDW 40.9  81.1 - 91.4 %   Platelets 148 (*) 150 - 400 K/uL   Neutrophils Relative % 74  43 - 77 %   Neutro Abs 6.9  1.7 - 7.7 K/uL   Lymphocytes Relative 15  12 - 46 %   Lymphs Abs 1.3  0.7 - 4.0 K/uL   Monocytes Relative 10  3 - 12 %   Monocytes Absolute 1.0  0.1 - 1.0 K/uL   Eosinophils Relative 1  0 - 5 %   Eosinophils Absolute 0.1  0.0 - 0.7 K/uL   Basophils Relative 0  0 - 1 %   Basophils Absolute 0.0  0.0 - 0.1 K/uL  BASIC METABOLIC PANEL      Result Value Ref Range   Sodium 137  137 - 147 mEq/L   Potassium 3.8  3.7 - 5.3 mEq/L   Chloride 98  96 - 112 mEq/L   CO2 24  19 - 32 mEq/L   Glucose, Bld 99  70 - 99 mg/dL   BUN 16  6 - 23 mg/dL   Creatinine, Ser 7.82  0.50 - 1.35 mg/dL   Calcium 9.3  8.4 - 95.6 mg/dL   GFR calc non Af Amer 88 (*) >90 mL/min   GFR calc Af Amer >90  >90 mL/min   No results found.    Imaging Review No results found.   EKG Interpretation None     Medications - No data to display  11:40 AM- Treatment plan was discussed with patient who verbalizes understanding and agrees.   MDM   Final diagnoses:  None   Filed Vitals:   03/23/14 0951  BP: 169/106  Pulse: 74  Temp: 98.1 F (36.7 C)  TempSrc: Oral  Resp: 22  SpO2: 98%     BP 144/100  Pulse 63  Temp(Src) 98 F (36.7 C) (Oral)  Resp 12  SpO2 98%  Patient noted to be hypertensive in the emergency department.  No signs of hypertensive urgency.  Discussed with patient the need for close follow-up and management by their primary care physician. Seen by case manager and set up for follow up with community health and wellness center.   I  personally performed the services described in this documentation, which was scribed in my presence. The recorded information has been reviewed and is accurate.      Arthor Captain, PA-C 03/23/14 1249

## 2014-03-23 NOTE — Progress Notes (Signed)
CARE MANAGEMENT NOTE 03/23/2014  Patient:  Theodore Perez, Theodore Perez   Account Number:  [id number]  Date Initiated:  03/23/2014  Documentation initiated by:  [facility]  Subjective/Objective Assessment:   ED call     Action/Plan:   Anticipated DC Date:  03/23/2014   Anticipated DC Plan:  HOME/SELF CARE         Choice offered to / List presented to:             Status of service:  Completed, signed off Medicare Important Message given?   (If response is "NO", the following Medicare IM given date fields will be blank) Date Medicare IM given:   Medicare IM given by:   Date Additional Medicare IM given:   Additional Medicare IM given by:    Discharge Disposition:  HOME/SELF CARE  Per UR Regulation:    If discussed at Long Length of Stay Meetings, dates discussed:    Comments:  03/23/14 12:00 CM met with pt in T-6 and pt gives CM expired (2013) Medicaid card.  CM points out the number on the card and encourages pt to call and have his card renewed.  Pt has Medicare A and B and states he has plenty of money for his medications.  Pt states he knows he's in denial about his HTN but plans to do better from here on out.  CM gives pt [place] pamphlet and pt verbalizes undersatanding he is to go to the clinic any weekday morning of this coming week from 9-10am and ask for AN APPOINTMENT TO GET A PRIMARY CARE PHYSICIAN AN APPOINTMENT WITH A NAVIGATOR TO REINSTUTE HIS MEDICAID AN APPOINTMENT FOR FOLLOW UP MEDICAL CARE.  No other CM needs were communicated.  Mariane Masters, BSN, CM [phone].

## 2014-03-23 NOTE — ED Notes (Signed)
He states he woke this am and did not feel well. He felt his Bp was elevated because its been high in the past but hes never had to take medication for it. He denies headache, SOB or CP.

## 2014-03-23 NOTE — Discharge Instructions (Signed)
Hypertension Hypertension, commonly called high blood pressure, is when the force of blood pumping through your arteries is too strong. Your arteries are the blood vessels that carry blood from your heart throughout your body. A blood pressure reading consists of a higher number over a lower number, such as 110/72. The higher number (systolic) is the pressure inside your arteries when your heart pumps. The lower number (diastolic) is the pressure inside your arteries when your heart relaxes. Ideally you want your blood pressure below 120/80. Hypertension forces your heart to work harder to pump blood. Your arteries may become narrow or stiff. Having hypertension puts you at risk for heart disease, stroke, and other problems.  RISK FACTORS Some risk factors for high blood pressure are controllable. Others are not.  Risk factors you cannot control include:   Race. You may be at higher risk if you are African American.  Age. Risk increases with age.  Gender. Men are at higher risk than women before age 45 years. After age 65, women are at higher risk than men. Risk factors you can control include:  Not getting enough exercise or physical activity.  Being overweight.  Getting too much fat, sugar, calories, or salt in your diet.  Drinking too much alcohol. SIGNS AND SYMPTOMS Hypertension does not usually cause signs or symptoms. Extremely high blood pressure (hypertensive crisis) may cause headache, anxiety, shortness of breath, and nosebleed. DIAGNOSIS  To check if you have hypertension, your health care provider will measure your blood pressure while you are seated, with your arm held at the level of your heart. It should be measured at least twice using the same arm. Certain conditions can cause a difference in blood pressure between your right and left arms. A blood pressure reading that is higher than normal on one occasion does not mean that you need treatment. If one blood pressure reading  is high, ask your health care provider about having it checked again. TREATMENT  Treating high blood pressure includes making lifestyle changes and possibly taking medicine. Living a healthy lifestyle can help lower high blood pressure. You may need to change some of your habits. Lifestyle changes may include:  Following the DASH diet. This diet is high in fruits, vegetables, and whole grains. It is low in salt, red meat, and added sugars.  Getting at least 2 hours of brisk physical activity every week.  Losing weight if necessary.  Not smoking.  Limiting alcoholic beverages.  Learning ways to reduce stress. If lifestyle changes are not enough to get your blood pressure under control, your health care provider may prescribe medicine. You may need to take more than one. Work closely with your health care provider to understand the risks and benefits. HOME CARE INSTRUCTIONS  Have your blood pressure rechecked as directed by your health care provider.   Take medicines only as directed by your health care provider. Follow the directions carefully. Blood pressure medicines must be taken as prescribed. The medicine does not work as well when you skip doses. Skipping doses also puts you at risk for problems.   Do not smoke.   Monitor your blood pressure at home as directed by your health care provider. SEEK MEDICAL CARE IF:   You think you are having a reaction to medicines taken.  You have recurrent headaches or feel dizzy.  You have swelling in your ankles.  You have trouble with your vision. SEEK IMMEDIATE MEDICAL CARE IF:  You develop a severe headache or confusion.    You have unusual weakness, numbness, or feel faint.  You have severe chest or abdominal pain.  You vomit repeatedly.  You have trouble breathing. MAKE SURE YOU:   Understand these instructions.  Will watch your condition.  Will get help right away if you are not doing well or get worse. Document  Released: 06/28/2005 Document Revised: 11/12/2013 Document Reviewed: 04/20/2013 ExitCare Patient Information 2015 [employer]. This information is not intended to replace advice given to you by your health care provider. Make sure you discuss any questions you have with your health care provider. DASH Eating Plan DASH stands for "Dietary Approaches to Stop Hypertension." The DASH eating plan is a healthy eating plan that has been shown to reduce high blood pressure (hypertension). Additional health benefits may include reducing the risk of type 2 diabetes mellitus, heart disease, and stroke. The DASH eating plan may also help with weight loss. WHAT DO I NEED TO KNOW ABOUT THE DASH EATING PLAN? For the DASH eating plan, you will follow these general guidelines:  Choose foods with a percent daily value for sodium of less than 5% (as listed on the food label).  Use salt-free seasonings or herbs instead of table salt or sea salt.  Check with your health care provider or pharmacist before using salt substitutes.  Eat lower-sodium products, often labeled as "lower sodium" or "no salt added."  Eat fresh foods.  Eat more vegetables, fruits, and low-fat dairy products.  Choose whole grains. Look for the word "whole" as the first word in the ingredient list.  Choose fish and skinless chicken or turkey more often than red meat. Limit fish, poultry, and meat to 6 oz (170 g) each day.  Limit sweets, desserts, sugars, and sugary drinks.  Choose heart-healthy fats.  Limit cheese to 1 oz (28 g) per day.  Eat more home-cooked food and less restaurant, buffet, and fast food.  Limit fried foods.  Cook foods using methods other than frying.  Limit canned vegetables. If you do use them, rinse them well to decrease the sodium.  When eating at a restaurant, ask that your food be prepared with less salt, or no salt if possible. WHAT FOODS CAN I EAT? Seek help from a dietitian for individual  calorie needs. Grains Whole grain or whole wheat bread. Brown rice. Whole grain or whole wheat pasta. Quinoa, bulgur, and whole grain cereals. Low-sodium cereals. Corn or whole wheat flour tortillas. Whole grain cornbread. Whole grain crackers. Low-sodium crackers. Vegetables Fresh or frozen vegetables (raw, steamed, roasted, or grilled). Low-sodium or reduced-sodium tomato and vegetable juices. Low-sodium or reduced-sodium tomato sauce and paste. Low-sodium or reduced-sodium canned vegetables.  Fruits All fresh, canned (in natural juice), or frozen fruits. Meat and Other Protein Products Ground beef (85% or leaner), grass-fed beef, or beef trimmed of fat. Skinless chicken or turkey. Ground chicken or turkey. Pork trimmed of fat. All fish and seafood. Eggs. Dried beans, peas, or lentils. Unsalted nuts and seeds. Unsalted canned beans. Dairy Low-fat dairy products, such as skim or 1% milk, 2% or reduced-fat cheeses, low-fat ricotta or cottage cheese, or plain low-fat yogurt. Low-sodium or reduced-sodium cheeses. Fats and Oils Tub margarines without trans fats. Light or reduced-fat mayonnaise and salad dressings (reduced sodium). Avocado. Safflower, olive, or canola oils. Natural peanut or almond butter. Other Unsalted popcorn and pretzels. The items listed above may not be a complete list of recommended foods or beverages. Contact your dietitian for more options. WHAT FOODS ARE NOT RECOMMENDED? Grains White bread.   White pasta. White rice. Refined cornbread. Bagels and croissants. Crackers that contain trans fat. Vegetables Creamed or fried vegetables. Vegetables in a cheese sauce. Regular canned vegetables. Regular canned tomato sauce and paste. Regular tomato and vegetable juices. Fruits Dried fruits. Canned fruit in light or heavy syrup. Fruit juice. Meat and Other Protein Products Fatty cuts of meat. Ribs, chicken wings, bacon, sausage, bologna, salami, chitterlings, fatback, hot dogs,  bratwurst, and packaged luncheon meats. Salted nuts and seeds. Canned beans with salt. Dairy Whole or 2% milk, cream, half-and-half, and cream cheese. Whole-fat or sweetened yogurt. Full-fat cheeses or blue cheese. Nondairy creamers and whipped toppings. Processed cheese, cheese spreads, or cheese curds. Condiments Onion and garlic salt, seasoned salt, table salt, and sea salt. Canned and packaged gravies. Worcestershire sauce. Tartar sauce. Barbecue sauce. Teriyaki sauce. Soy sauce, including reduced sodium. Steak sauce. Fish sauce. Oyster sauce. Cocktail sauce. Horseradish. Ketchup and mustard. Meat flavorings and tenderizers. Bouillon cubes. Hot sauce. Tabasco sauce. Marinades. Taco seasonings. Relishes. Fats and Oils Butter, stick margarine, lard, shortening, ghee, and bacon fat. Coconut, palm kernel, or palm oils. Regular salad dressings. Other Pickles and olives. Salted popcorn and pretzels. The items listed above may not be a complete list of foods and beverages to avoid. Contact your dietitian for more information. WHERE CAN I FIND MORE INFORMATION? National Heart, Lung, and Blood Institute: www.nhlbi.nih.gov/health/health-topics/topics/dash/ Document Released: 06/17/2011 Document Revised: 11/12/2013 Document Reviewed: 05/02/2013 ExitCare Patient Information 2015 [employer]. This information is not intended to replace advice given to you by your health care provider. Make sure you discuss any questions you have with your health care provider. Managing Your High Blood Pressure Blood pressure is a measurement of how forceful your blood is pressing against the walls of the arteries. Arteries are muscular tubes within the circulatory system. Blood pressure does not stay the same. Blood pressure rises when you are active, excited, or nervous; and it lowers during sleep and relaxation. If the numbers measuring your blood pressure stay above normal most of the time, you are at risk for health  problems. High blood pressure (hypertension) is a long-term (chronic) condition in which blood pressure is elevated. A blood pressure reading is recorded as two numbers, such as 120 over 80 (or 120/80). The first, higher number is called the systolic pressure. It is a measure of the pressure in your arteries as the heart beats. The second, lower number is called the diastolic pressure. It is a measure of the pressure in your arteries as the heart relaxes between beats.  Keeping your blood pressure in a normal range is important to your overall health and prevention of health problems, such as heart disease and stroke. When your blood pressure is uncontrolled, your heart has to work harder than normal. High blood pressure is a very common condition in adults because blood pressure tends to rise with age. Men and women are equally likely to have hypertension but at different times in life. Before age 45, men are more likely to have hypertension. After 31 years of age, women are more likely to have it. Hypertension is especially common in African Americans. This condition often has no signs or symptoms. The cause of the condition is usually not known. Your caregiver can help you come up with a plan to keep your blood pressure in a normal, healthy range. BLOOD PRESSURE STAGES Blood pressure is classified into four stages: normal, prehypertension, stage 1, and stage 2. Your blood pressure reading will be used to determine what   type of treatment, if any, is necessary. Appropriate treatment options are tied to these four stages:  Normal  Systolic pressure (mm Hg): below 120.  Diastolic pressure (mm Hg): below 80. Prehypertension  Systolic pressure (mm Hg): 120 to 139.  Diastolic pressure (mm Hg): 80 to 89. Stage1  Systolic pressure (mm Hg): 140 to 159.  Diastolic pressure (mm Hg): 90 to 99. Stage2  Systolic pressure (mm Hg): 160 or above.  Diastolic pressure (mm Hg): 100 or above. RISKS RELATED  TO HIGH BLOOD PRESSURE Managing your blood pressure is an important responsibility. Uncontrolled high blood pressure can lead to:  A heart attack.  A stroke.  A weakened blood vessel (aneurysm).  Heart failure.  Kidney damage.  Eye damage.  Metabolic syndrome.  Memory and concentration problems. HOW TO MANAGE YOUR BLOOD PRESSURE Blood pressure can be managed effectively with lifestyle changes and medicines (if needed). Your caregiver will help you come up with a plan to bring your blood pressure within a normal range. Your plan should include the following: Education  Read all information provided by your caregivers about how to control blood pressure.  Educate yourself on the latest guidelines and treatment recommendations. New research is always being done to further define the risks and treatments for high blood pressure. Lifestylechanges  Control your weight.  Avoid smoking.  Stay physically active.  Reduce the amount of salt in your diet.  Reduce stress.  Control any chronic conditions, such as high cholesterol or diabetes.  Reduce your alcohol intake. Medicines  Several medicines (antihypertensive medicines) are available, if needed, to bring blood pressure within a normal range. Communication  Review all the medicines you take with your caregiver because there may be side effects or interactions.  Talk with your caregiver about your diet, exercise habits, and other lifestyle factors that may be contributing to high blood pressure.  See your caregiver regularly. Your caregiver can help you create and adjust your plan for managing high blood pressure. RECOMMENDATIONS FOR TREATMENT AND FOLLOW-UP  The following recommendations are based on current guidelines for managing high blood pressure in nonpregnant adults. Use these recommendations to identify the proper follow-up period or treatment option based on your blood pressure reading. You can discuss these  options with your caregiver.  Systolic pressure of 120 to 139 or diastolic pressure of 80 to 89: Follow up with your caregiver as directed.  Systolic pressure of 140 to 160 or diastolic pressure of 90 to 100: Follow up with your caregiver within 2 months.  Systolic pressure above 160 or diastolic pressure above 100: Follow up with your caregiver within 1 month.  Systolic pressure above 180 or diastolic pressure above 110: Consider antihypertensive therapy; follow up with your caregiver within 1 week.  Systolic pressure above 200 or diastolic pressure above 120: Begin antihypertensive therapy; follow up with your caregiver within 1 week. Document Released: 03/22/2012 Document Reviewed: 03/22/2012 ExitCare Patient Information 2015 [employer]. This information is not intended to replace advice given to you by your health care provider. Make sure you discuss any questions you have with your health care provider.  

## 2014-03-25 NOTE — Care Management Note (Signed)
    Page 1 of 1   03/23/2014     5:17:46 PM CARE MANAGEMENT NOTE 03/23/2014  Patient:  Theodore Perez, Theodore Perez   Account Number:  [id number]  Date Initiated:  03/23/2014  Documentation initiated by:  [facility]  Subjective/Objective Assessment:   ED call     Action/Plan:   Anticipated DC Date:  03/23/2014   Anticipated DC Plan:  HOME/SELF CARE         Choice offered to / List presented to:             Status of service:  Completed, signed off Medicare Important Message given?   (If response is "NO", the following Medicare IM given date fields will be blank) Date Medicare IM given:   Medicare IM given by:   Date Additional Medicare IM given:   Additional Medicare IM given by:    Discharge Disposition:  HOME/SELF CARE  Per UR Regulation:    If discussed at Long Length of Stay Meetings, dates discussed:    Comments:  03/23/14 12:00 CM met with pt in T-6 and pt gives CM expired (2013) Medicaid card.  CM points out the number on the card and encourages pt to call and have his card renewed.  Pt has Medicare A and B and states he has plenty of money for his medications.  Pt states he knows he's in denial about his HTN but plans to do better from here on out.  CM gives pt [place] pamphlet and pt verbalizes undersatanding he is to go to the clinic any weekday morning of this coming week from 9-10am and ask for AN APPOINTMENT TO GET A PRIMARY CARE PHYSICIAN AN APPOINTMENT WITH A NAVIGATOR TO REINSTUTE HIS MEDICAID AN APPOINTMENT FOR FOLLOW UP MEDICAL CARE.  No other CM needs were communicated.  Mariane Masters, BSN, CM [phone].

## 2014-04-17 ENCOUNTER — Ambulatory Visit (INDEPENDENT_AMBULATORY_CARE_PROVIDER_SITE_OTHER): Payer: Self-pay | Admitting: Family Medicine

## 2014-04-17 ENCOUNTER — Encounter: Payer: Self-pay | Admitting: Family Medicine

## 2014-04-17 VITALS — BP 174/99 | HR 76 | Temp 98.4°F | Wt 220.0 lb

## 2014-04-17 DIAGNOSIS — I10 Essential (primary) hypertension: Secondary | ICD-10-CM | POA: Insufficient documentation

## 2014-04-17 DIAGNOSIS — R3589 Other polyuria: Secondary | ICD-10-CM

## 2014-04-17 DIAGNOSIS — F429 Obsessive-compulsive disorder, unspecified: Secondary | ICD-10-CM | POA: Insufficient documentation

## 2014-04-17 DIAGNOSIS — E119 Type 2 diabetes mellitus without complications: Secondary | ICD-10-CM

## 2014-04-17 DIAGNOSIS — F32A Depression, unspecified: Secondary | ICD-10-CM

## 2014-04-17 DIAGNOSIS — R358 Other polyuria: Secondary | ICD-10-CM

## 2014-04-17 DIAGNOSIS — Z5181 Encounter for therapeutic drug level monitoring: Secondary | ICD-10-CM

## 2014-04-17 DIAGNOSIS — F42 Obsessive-compulsive disorder: Secondary | ICD-10-CM

## 2014-04-17 DIAGNOSIS — F329 Major depressive disorder, single episode, unspecified: Secondary | ICD-10-CM

## 2014-04-17 LAB — CBC
HEMATOCRIT: 43.8 % (ref 39.0–52.0)
HEMOGLOBIN: 15.1 g/dL (ref 13.0–17.0)
MCH: 29.5 pg (ref 26.0–34.0)
MCHC: 34.5 g/dL (ref 30.0–36.0)
MCV: 85.5 fL (ref 78.0–100.0)
Platelets: 279 10*3/uL (ref 150–400)
RBC: 5.12 MIL/uL (ref 4.22–5.81)
RDW: 14.2 % (ref 11.5–15.5)
WBC: 8 10*3/uL (ref 4.0–10.5)

## 2014-04-17 LAB — COMPREHENSIVE METABOLIC PANEL
ALBUMIN: 4.9 g/dL (ref 3.5–5.2)
ALT: 46 U/L (ref 0–53)
AST: 84 U/L — AB (ref 0–37)
Alkaline Phosphatase: 44 U/L (ref 39–117)
BUN: 17 mg/dL (ref 6–23)
CALCIUM: 10.3 mg/dL (ref 8.4–10.5)
CHLORIDE: 99 meq/L (ref 96–112)
CO2: 27 meq/L (ref 19–32)
Creat: 1.21 mg/dL (ref 0.50–1.35)
GLUCOSE: 85 mg/dL (ref 70–99)
POTASSIUM: 4.3 meq/L (ref 3.5–5.3)
Sodium: 138 mEq/L (ref 135–145)
Total Bilirubin: 0.5 mg/dL (ref 0.2–1.2)
Total Protein: 7.7 g/dL (ref 6.0–8.3)

## 2014-04-17 LAB — LIPID PANEL
Cholesterol: 185 mg/dL (ref 0–200)
HDL: 42 mg/dL (ref >39)
LDL Cholesterol: 100 mg/dL — ABNORMAL HIGH (ref 0–99)
Total CHOL/HDL Ratio: 4.4 Ratio
Triglycerides: 215 mg/dL — ABNORMAL HIGH (ref <150)
VLDL: 43 mg/dL — ABNORMAL HIGH (ref 0–40)

## 2014-04-17 LAB — POCT GLYCOSYLATED HEMOGLOBIN (HGB A1C): HEMOGLOBIN A1C: 5.9

## 2014-04-17 MED ORDER — HYDROCHLOROTHIAZIDE 25 MG PO TABS: 25.0000 mg | ORAL_TABLET | Freq: Every day | ORAL | Status: DC

## 2014-04-17 MED ORDER — LISINOPRIL 10 MG PO TABS: 10.0000 mg | ORAL_TABLET | Freq: Every day | ORAL | Status: DC

## 2014-04-17 NOTE — Progress Notes (Signed)
  Patient name: Lindajo Royaldward S Zhen MRN [id number]  Date of birth: 1980-10-28  CC & HPI:  Lindajo Royaldward S Sear is a 33 y.o. male presenting today for HTN. He reports having HTN since he was a child but was not compliant with his medications. Recently went to ED for HA and vision problems and started on HCTZ which he has been compliant with. Denies CP, SOB or LE swelling. He takes Risperdal and Zoloft for OCD and depression which [facility]. Denies tobacco, alcohol or illicit drug use. He has been trying to limit his fast food intake. Exercises twice a week.    ROS: See HPI   Medications & Allergies: Reviewed  Social History: Reviewed:   Objective Findings:  Vitals: BP 174/99  Pulse 76  Temp(Src) 98.4 F (36.9 C) (Oral)  Wt 220 lb (99.791 kg)  Gen: NAD CV: RRR w/o m/r/g, pulses +2 b/l Resp: CTAB w/ normal respiratory effort LE:  No swelling  Assessment & Plan:   Please See Problem Focused Assessment & Plan

## 2014-04-17 NOTE — Patient Instructions (Signed)
It was great seeing you today.   1. Start taking Lisinopril (10mg ) every daily.  1. Discontinue if you develop facial swelling or trouble breathing 2. Call the clinic if you develop a cough, rash, or dizziness 2. Continue to take HCTZ (25mg ) daily   Please bring all your medications to every doctors visit  Sign up for My Chart to have easy access to your labs results, and communication with your Primary care physician.  Next Appointment  Please call to make an appointment with Dr Gayla DossJoyner in 2-3 weeks   I look forward to talking with you again at our next visit. If you have any questions or concerns before then, please call the clinic at [phone].  Take Care,   Dr Wenda LowJames Jirah Rider

## 2014-04-17 NOTE — Addendum Note (Signed)
Addended by: [place]ORDAN, Joline Encalada on: 04/17/2014 03:32 PM   Modules accepted: Orders

## 2014-04-17 NOTE — Assessment & Plan Note (Signed)
Report HTN since he was a child and strong FHx of HTN; HTN possibly complicated by chronic antipsychotic meds - Continue HCTZ 25 mg qd - Start Lisinopril  10mg  qd - Check CBC, CMP, and Lipids - f/u in 2-3 weeks for BP recheck; consider work-up for secondary causes if BP unimproved  - Will discuss diet more at next visit

## 2014-04-17 NOTE — Assessment & Plan Note (Signed)
[facility] - He will discuss ongoing Risperdal and possible side effects: metabolic syndrome - Check A1c and lipids

## 2014-04-17 NOTE — Assessment & Plan Note (Signed)
He denies any current symptoms but was told he was depressed.  - he will discuss with [facility] in need for ongoing Zoloft versus trial off

## 2014-09-17 DIAGNOSIS — F42 Obsessive-compulsive disorder: Secondary | ICD-10-CM | POA: Diagnosis not present

## 2015-01-20 DIAGNOSIS — F42 Obsessive-compulsive disorder: Secondary | ICD-10-CM | POA: Diagnosis not present

## 2015-03-11 DIAGNOSIS — F42 Obsessive-compulsive disorder: Secondary | ICD-10-CM | POA: Diagnosis not present

## 2015-05-24 ENCOUNTER — Encounter (HOSPITAL_COMMUNITY): Payer: Self-pay

## 2015-05-24 ENCOUNTER — Emergency Department (HOSPITAL_COMMUNITY)
Admission: EM | Admit: 2015-05-24 | Discharge: 2015-05-24 | Disposition: A | Discharge status: Home / Self Care | Payer: Medicare Other | Attending: Emergency Medicine | Admitting: Emergency Medicine

## 2015-05-24 DIAGNOSIS — Z880 Allergy status to penicillin: Secondary | ICD-10-CM | POA: Diagnosis not present

## 2015-05-24 DIAGNOSIS — I10 Essential (primary) hypertension: Secondary | ICD-10-CM | POA: Insufficient documentation

## 2015-05-24 DIAGNOSIS — R05 Cough: Secondary | ICD-10-CM | POA: Diagnosis not present

## 2015-05-24 DIAGNOSIS — J020 Streptococcal pharyngitis: Secondary | ICD-10-CM

## 2015-05-24 DIAGNOSIS — J029 Acute pharyngitis, unspecified: Secondary | ICD-10-CM | POA: Diagnosis present

## 2015-05-24 DIAGNOSIS — Z79899 Other long term (current) drug therapy: Secondary | ICD-10-CM | POA: Insufficient documentation

## 2015-05-24 LAB — RAPID STREP SCREEN (MED CTR MEBANE ONLY): STREPTOCOCCUS, GROUP A SCREEN (DIRECT): POSITIVE — AB

## 2015-05-24 MED ORDER — DEXAMETHASONE 10 MG/ML FOR PEDIATRIC ORAL USE
10.0000 mg | Freq: Once | INTRAMUSCULAR | Status: AC
  Administered 2015-05-24: 10 mg via ORAL
  Filled 2015-05-24 (×2): qty 1

## 2015-05-24 MED ORDER — AZITHROMYCIN 500 MG PO TABS: 500.0000 mg | ORAL_TABLET | Freq: Every day | ORAL | Status: DC

## 2015-05-24 NOTE — Discharge Instructions (Signed)

## 2015-05-24 NOTE — ED Provider Notes (Signed)
CSN: [id number]     Arrival date & time 05/24/15  0825 History   First MD Initiated Contact with Patient 05/24/15 0831     Chief Complaint  Patient presents with  . Sore Throat   Theodore Perez is a 34 y.o. male with a history of hypertension who presents to the emergency department complaining of a sore throat and cough since last night. Patient also reports chills but denies fevers. He currently complains of 2 out of 10 pain in his throat. He denies trouble swallowing. He denies recent antibiotic use. He reports he had one episode of blood streaked in his sputum after coughing. The patient denies fevers, wheezing, shortness of breath, abdominal pain, nausea, vomiting, trouble swallowing, drooling, ear pain, ear discharge, lightheadedness or dizziness.  (Consider location/radiation/quality/duration/timing/severity/associated sxs/prior Treatment) HPI  Past Medical History  Diagnosis Date  . Hypertension    History reviewed. No pertinent past surgical history. History reviewed. No pertinent family history. Social History  Substance Use Topics  . Smoking status: Never Smoker   . Smokeless tobacco: None  . Alcohol Use: No     Comment: occasionally    Review of Systems  Constitutional: Positive for chills. Negative for fever and appetite change.  HENT: Positive for rhinorrhea and sore throat. Negative for congestion, ear discharge, ear pain, postnasal drip, sneezing and trouble swallowing.   Eyes: Negative for redness.  Respiratory: Positive for cough. Negative for chest tightness, shortness of breath and wheezing.   Cardiovascular: Negative for chest pain.  Gastrointestinal: Negative for nausea, vomiting and abdominal pain.  Musculoskeletal: Negative for myalgias.  Skin: Negative for rash.  Neurological: Negative for dizziness, weakness and light-headedness.      Allergies  Penicillins  Home Medications   Prior to Admission medications   Medication Sig Start Date End Date  Taking? Authorizing Provider  acetaminophen (TYLENOL) 500 MG tablet Take 500 mg by mouth every 6 (six) hours as needed (pain).   Yes Historical Provider, MD  RisperiDONE (RISPERDAL PO) Take 1 tablet by mouth 2 (two) times daily.   Yes Historical Provider, MD  sertraline (ZOLOFT) 100 MG tablet Take 100 mg by mouth at bedtime.  08/18/13  Yes Historical Provider, MD  azithromycin (ZITHROMAX) 500 MG tablet Take 1 tablet (500 mg total) by mouth daily. 05/24/15   Everlene Farrier, PA-C  hydrochlorothiazide (HYDRODIURIL) 25 MG tablet Take 1 tablet (25 mg total) by mouth daily. Patient not taking: Reported on 05/24/2015 04/17/14   Jamal Collin, MD  lisinopril (PRINIVIL,ZESTRIL) 10 MG tablet Take 1 tablet (10 mg total) by mouth daily. Patient not taking: Reported on 05/24/2015 04/17/14   Jamal Collin, MD   BP 142/98 mmHg  Pulse 79  Temp(Src) 97.5 F (36.4 C) (Oral)  Resp 16  SpO2 98% Physical Exam  Constitutional: He is oriented to person, place, and time. He appears well-developed and well-nourished. No distress.  Nontoxic appearing.  HENT:  Head: Normocephalic and atraumatic.  Right Ear: External ear normal.  Left Ear: External ear normal.  Nose: Nose normal.  Mouth/Throat: No oropharyngeal exudate.  Mild bilateral tonsillar hypertrophy without exudates. Uvula is midline without edema. No peritonsillar abscess. No drooling. No trismus.  Bilateral tympanic membranes are pearly-gray without erythema or loss of landmarks.   Eyes: Conjunctivae are normal. Pupils are equal, round, and reactive to light. Right eye exhibits no discharge. Left eye exhibits no discharge.  Neck: Normal range of motion. Neck supple.  Cardiovascular: Normal rate, regular rhythm, normal heart sounds and intact  distal pulses.   Pulmonary/Chest: Effort normal and breath sounds normal. No respiratory distress. He has no wheezes. He has no rales.  Lungs are clear to auscultation bilaterally.  Abdominal: Soft. There is no  tenderness.  Musculoskeletal: Normal range of motion.  Lymphadenopathy:    He has no cervical adenopathy.  Neurological: He is alert and oriented to person, place, and time. Coordination normal.  Skin: Skin is warm and dry. No rash noted. He is not diaphoretic. No erythema. No pallor.  Psychiatric: He has a normal mood and affect. His behavior is normal.  Nursing note and vitals reviewed.   ED Course  Procedures (including critical care time) Labs Review Labs Reviewed  RAPID STREP SCREEN (NOT [facility]) - Abnormal; Notable for the following:    Streptococcus, Group A Screen (Direct) POSITIVE (*)    All other components within normal limits    Imaging Review No results found. I have personally reviewed and evaluated these lab results as part of my medical decision-making.   EKG Interpretation None      Filed Vitals:   05/24/15 0838  BP: 142/98  Pulse: 79  Temp: 97.5 F (36.4 C)  TempSrc: Oral  Resp: 16  SpO2: 98%     MDM   Meds given in ED:  Medications  dexamethasone (DECADRON) 10 MG/ML injection for Pediatric ORAL use 10 mg (10 mg Oral Given 05/24/15 0939)    Discharge Medication List as of 05/24/2015  9:29 AM    START taking these medications   Details  azithromycin (ZITHROMAX) 500 MG tablet Take 1 tablet (500 mg total) by mouth daily., Starting 05/24/2015, Until Discontinued, Print        Final diagnoses:  Strep pharyngitis   This  is a 34 y.o. male with a history of hypertension who presents to the emergency department complaining of a sore throat and cough since last night. Patient also reports chills but denies fevers. He currently complains of 2 out of 10 pain in his throat. He denies trouble swallowing. On exam the patient is afebrile nontoxic appearing. He has mild bilateral tonsillar hypertrophy without exudates. Uvula is midline. No peritonsillar abscess. No trismus. No drooling. His lungs are clear to auscultation bilaterally. Rapid strep is  positive. Will treat for strep pharyngitis with azithromycin as the patient has a penicillin allergy. Patient also provided with Decadron prior to discharge. Patient tolerating by mouth without difficulty. I advised the patient to follow-up with their primary care provider this week. I advised the patient to return to the emergency department with new or worsening symptoms or new concerns. The patient verbalized understanding and agreement with plan.       Everlene FarrierWilliam Satina Jerrell, PA-C 05/24/15 1036  Laurence Spatesachel Morgan Little, MD 05/25/15 [id number]

## 2015-05-24 NOTE — ED Notes (Signed)
Pt c/o sore throat x few days with cough. Pt c/o two episodes of coughing up red-tinged sputum. Throat appears red/irritated.

## 2015-06-10 DIAGNOSIS — F422 Mixed obsessional thoughts and acts: Secondary | ICD-10-CM | POA: Diagnosis not present

## 2015-09-11 DIAGNOSIS — F422 Mixed obsessional thoughts and acts: Secondary | ICD-10-CM | POA: Diagnosis not present

## 2015-11-03 DIAGNOSIS — F422 Mixed obsessional thoughts and acts: Secondary | ICD-10-CM | POA: Diagnosis not present

## 2015-12-28 ENCOUNTER — Emergency Department (HOSPITAL_COMMUNITY): Payer: No Typology Code available for payment source

## 2015-12-28 ENCOUNTER — Encounter (HOSPITAL_COMMUNITY): Payer: Self-pay | Admitting: Emergency Medicine

## 2015-12-28 ENCOUNTER — Emergency Department (HOSPITAL_COMMUNITY)
Admission: EM | Admit: 2015-12-28 | Discharge: 2015-12-28 | Disposition: A | Discharge status: Home / Self Care | Payer: No Typology Code available for payment source | Attending: Emergency Medicine | Admitting: Emergency Medicine

## 2015-12-28 DIAGNOSIS — S70312A Abrasion, left thigh, initial encounter: Secondary | ICD-10-CM | POA: Diagnosis not present

## 2015-12-28 DIAGNOSIS — S7012XA Contusion of left thigh, initial encounter: Secondary | ICD-10-CM

## 2015-12-28 DIAGNOSIS — Y92410 Unspecified street and highway as the place of occurrence of the external cause: Secondary | ICD-10-CM | POA: Insufficient documentation

## 2015-12-28 DIAGNOSIS — T148 Other injury of unspecified body region: Secondary | ICD-10-CM | POA: Diagnosis not present

## 2015-12-28 DIAGNOSIS — Y999 Unspecified external cause status: Secondary | ICD-10-CM | POA: Diagnosis not present

## 2015-12-28 DIAGNOSIS — Y9389 Activity, other specified: Secondary | ICD-10-CM | POA: Insufficient documentation

## 2015-12-28 DIAGNOSIS — Z79899 Other long term (current) drug therapy: Secondary | ICD-10-CM | POA: Diagnosis not present

## 2015-12-28 DIAGNOSIS — M79605 Pain in left leg: Secondary | ICD-10-CM | POA: Diagnosis not present

## 2015-12-28 DIAGNOSIS — I10 Essential (primary) hypertension: Secondary | ICD-10-CM | POA: Insufficient documentation

## 2015-12-28 MED ORDER — METHOCARBAMOL 500 MG PO TABS: 500.0000 mg | ORAL_TABLET | Freq: Two times a day (BID) | ORAL | Status: DC | PRN

## 2015-12-28 MED ORDER — NAPROXEN 500 MG PO TABS: 500.0000 mg | ORAL_TABLET | Freq: Two times a day (BID) | ORAL | Status: DC

## 2015-12-28 MED ORDER — CYCLOBENZAPRINE HCL 10 MG PO TABS: 10.0000 mg | ORAL_TABLET | Freq: Two times a day (BID) | ORAL | Status: DC | PRN

## 2015-12-28 MED ORDER — NAPROXEN 500 MG PO TABS
500.0000 mg | ORAL_TABLET | Freq: Once | ORAL | Status: AC
  Administered 2015-12-28: 500 mg via ORAL
  Filled 2015-12-28: qty 1

## 2015-12-28 NOTE — ED Notes (Signed)
Pt ambulated well with crutches to the bathroom and in the hall.
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
## 2015-12-28 NOTE — ED Notes (Signed)
Pt ambulated to restroom with crutches
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
## 2016-01-21 DIAGNOSIS — F70 Mild intellectual disabilities: Secondary | ICD-10-CM | POA: Diagnosis not present

## 2016-04-09 ENCOUNTER — Emergency Department (HOSPITAL_COMMUNITY)
Admission: EM | Admit: 2016-04-09 | Discharge: 2016-04-10 | Disposition: A | Discharge status: Home / Self Care | Payer: Medicare Other | Attending: Emergency Medicine | Admitting: Emergency Medicine

## 2016-04-09 ENCOUNTER — Encounter (HOSPITAL_COMMUNITY): Payer: Self-pay | Admitting: Emergency Medicine

## 2016-04-09 DIAGNOSIS — Y929 Unspecified place or not applicable: Secondary | ICD-10-CM | POA: Insufficient documentation

## 2016-04-09 DIAGNOSIS — Y999 Unspecified external cause status: Secondary | ICD-10-CM | POA: Diagnosis not present

## 2016-04-09 DIAGNOSIS — S41112A Laceration without foreign body of left upper arm, initial encounter: Secondary | ICD-10-CM | POA: Insufficient documentation

## 2016-04-09 DIAGNOSIS — I10 Essential (primary) hypertension: Secondary | ICD-10-CM | POA: Insufficient documentation

## 2016-04-09 DIAGNOSIS — Z23 Encounter for immunization: Secondary | ICD-10-CM | POA: Diagnosis not present

## 2016-04-09 DIAGNOSIS — S41102A Unspecified open wound of left upper arm, initial encounter: Secondary | ICD-10-CM | POA: Diagnosis present

## 2016-04-09 DIAGNOSIS — Y939 Activity, unspecified: Secondary | ICD-10-CM | POA: Insufficient documentation

## 2016-04-09 MED ORDER — TETANUS-DIPHTH-ACELL PERTUSSIS 5-2.5-18.5 LF-MCG/0.5 IM SUSP
0.5000 mL | Freq: Once | INTRAMUSCULAR | Status: AC
  Administered 2016-04-09: 0.5 mL via INTRAMUSCULAR

## 2016-04-09 MED ORDER — TETANUS-DIPHTH-ACELL PERTUSSIS 5-2.5-18.5 LF-MCG/0.5 IM SUSP
INTRAMUSCULAR | Status: DC
Start: 2016-04-09 — End: 2016-04-10
  Filled 2016-04-09: qty 0.5
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
## 2016-04-10 DIAGNOSIS — S41112A Laceration without foreign body of left upper arm, initial encounter: Secondary | ICD-10-CM | POA: Diagnosis not present

## 2016-04-10 MED ORDER — LIDOCAINE HCL (PF) 1 % IJ SOLN
INTRAMUSCULAR | Status: AC
  Filled 2016-04-10: qty 10

## 2016-04-10 MED ORDER — LIDOCAINE HCL (PF) 1 % IJ SOLN
5.0000 mL | Freq: Once | INTRAMUSCULAR | Status: AC
  Administered 2016-04-10: 5 mL

## 2016-04-14 DIAGNOSIS — F422 Mixed obsessional thoughts and acts: Secondary | ICD-10-CM | POA: Diagnosis not present

## 2016-04-27 ENCOUNTER — Ambulatory Visit (HOSPITAL_COMMUNITY)
Admission: EM | Admit: 2016-04-27 | Discharge: 2016-04-27 | Disposition: A | Discharge status: Home / Self Care | Payer: Self-pay | Attending: Family Medicine | Admitting: Family Medicine

## 2016-04-27 DIAGNOSIS — Z4802 Encounter for removal of sutures: Secondary | ICD-10-CM

## 2016-04-27 NOTE — ED Provider Notes (Signed)
[facility]    CSN: [id number] Arrival date & time: 04/27/16  1515     History   Chief Complaint Chief Complaint  Patient presents with  . Suture / Staple Removal    HPI Theodore Perez is a 35 y.o. male.   The history is provided by the patient.  Suture / Staple Removal  This is a new problem. Episode onset: seen 9/29 for lac to left upper arm, healed, no problems. The problem has been resolved.    Past Medical History:  Diagnosis Date  . Hypertension     Patient Active Problem List   Diagnosis Date Noted  . HTN (hypertension) 04/17/2014  . OCD (obsessive compulsive disorder) 04/17/2014  . Depression 04/17/2014    No past surgical history on file.     Home Medications    Prior to Admission medications   Medication Sig Start Date End Date Taking? Authorizing Provider  cyclobenzaprine (FLEXERIL) 10 MG tablet Take 1 tablet (10 mg total) by mouth 2 (two) times daily as needed for muscle spasms. 12/28/15   Antony Madura, PA-C  naproxen (NAPROSYN) 500 MG tablet Take 1 tablet (500 mg total) by mouth 2 (two) times daily. 12/28/15   Antony Madura, PA-C  sertraline (ZOLOFT) 100 MG tablet Take 100 mg by mouth at bedtime.  08/18/13   Historical Provider, MD    Family History No family history on file.  Social History Social History  Substance Use Topics  . Smoking status: Never Smoker  . Smokeless tobacco: Never Used  . Alcohol use No     Comment: occasionally     Allergies   Penicillins   Review of Systems Review of Systems  Musculoskeletal: Negative.   Skin: Positive for wound.  All other systems reviewed and are negative.    Physical Exam Triage Vital Signs ED Triage Vitals [04/27/16 1547]  Enc Vitals Group     BP 163/78     Pulse Rate 83     Resp 18     Temp 98.6 F (37 C)     Temp Source Oral     SpO2 97 %     Weight      Height      Head Circumference      Peak Flow      Pain Score      Pain Loc      Pain Edu?      Excl. in  GC?    No data found.   Updated Vital Signs BP 163/78 (BP Location: Right Arm)   Pulse 83   Temp 98.6 F (37 C) (Oral)   Resp 18   SpO2 97%   Visual Acuity Right Eye Distance:   Left Eye Distance:   Bilateral Distance:    Right Eye Near:   Left Eye Near:    Bilateral Near:     Physical Exam  Constitutional: He is oriented to person, place, and time. He appears well-developed and well-nourished. No distress.  Neurological: He is alert and oriented to person, place, and time.  Skin: Skin is warm and dry.  Lac healed 2 staples removed. No infection.  Nursing note and vitals reviewed.    UC Treatments / Results  Labs (all labs ordered are listed, but only abnormal results are displayed) Labs Reviewed - No data to display  EKG  EKG Interpretation None       Radiology No results found.  Procedures Procedures (including critical care time)  Medications Ordered  in UC Medications - No data to display   Initial Impression / Assessment and Plan / UC Course  I have reviewed the triage vital signs and the nursing notes.  Pertinent labs & imaging results that were available during my care of the patient were reviewed by me and considered in my medical decision making (see chart for details).  Clinical Course      Final Clinical Impressions(s) / UC Diagnoses   Final diagnoses:  None    New Prescriptions New Prescriptions   No medications on file     Linna HoffJames D Eleanore Junio, MD 04/27/16 1611

## 2016-04-27 NOTE — ED Triage Notes (Addendum)
Pt   Is  Here  For  Staple  Removal  l  Arm      Staples  Have  Been  In  Place     For  Over   2   Weeks      Appears   Well      Healing

## 2016-07-22 DIAGNOSIS — F422 Mixed obsessional thoughts and acts: Secondary | ICD-10-CM | POA: Diagnosis not present

## 2016-10-13 DIAGNOSIS — F70 Mild intellectual disabilities: Secondary | ICD-10-CM | POA: Diagnosis not present

## 2017-01-10 DIAGNOSIS — F70 Mild intellectual disabilities: Secondary | ICD-10-CM | POA: Diagnosis not present

## 2017-04-13 DIAGNOSIS — F70 Mild intellectual disabilities: Secondary | ICD-10-CM | POA: Diagnosis not present

## 2017-08-03 DIAGNOSIS — F422 Mixed obsessional thoughts and acts: Secondary | ICD-10-CM | POA: Diagnosis not present

## 2017-10-18 DIAGNOSIS — F422 Mixed obsessional thoughts and acts: Secondary | ICD-10-CM | POA: Diagnosis not present

## 2018-01-10 DIAGNOSIS — F422 Mixed obsessional thoughts and acts: Secondary | ICD-10-CM | POA: Diagnosis not present

## 2018-01-10 DIAGNOSIS — F70 Mild intellectual disabilities: Secondary | ICD-10-CM | POA: Diagnosis not present

## 2018-04-17 DIAGNOSIS — F422 Mixed obsessional thoughts and acts: Secondary | ICD-10-CM | POA: Diagnosis not present

## 2018-04-17 DIAGNOSIS — F70 Mild intellectual disabilities: Secondary | ICD-10-CM | POA: Diagnosis not present

## 2018-07-19 DIAGNOSIS — F70 Mild intellectual disabilities: Secondary | ICD-10-CM | POA: Diagnosis not present

## 2018-07-19 DIAGNOSIS — F422 Mixed obsessional thoughts and acts: Secondary | ICD-10-CM | POA: Diagnosis not present

## 2018-07-21 ENCOUNTER — Ambulatory Visit (HOSPITAL_COMMUNITY)
Admission: EM | Admit: 2018-07-21 | Discharge: 2018-07-21 | Disposition: A | Discharge status: Home / Self Care | Payer: Medicare Other | Attending: Family Medicine | Admitting: Family Medicine

## 2018-07-21 ENCOUNTER — Encounter (HOSPITAL_COMMUNITY): Payer: Self-pay

## 2018-07-21 DIAGNOSIS — I10 Essential (primary) hypertension: Secondary | ICD-10-CM

## 2018-07-21 DIAGNOSIS — M79671 Pain in right foot: Secondary | ICD-10-CM

## 2018-07-21 DIAGNOSIS — Z77128 Contact with and (suspected) exposure to other hazards in the physical environment: Secondary | ICD-10-CM | POA: Insufficient documentation

## 2018-07-21 MED ORDER — MUPIROCIN 2 % EX OINT: 1.0000 "application " | TOPICAL_OINTMENT | Freq: Three times a day (TID) | CUTANEOUS | 1 refills | Status: DC

## 2018-07-21 MED ORDER — DOXYCYCLINE HYCLATE 100 MG PO TABS: 100.0000 mg | ORAL_TABLET | Freq: Two times a day (BID) | ORAL | 0 refills | Status: DC

## 2018-07-21 NOTE — ED Provider Notes (Signed)
[facility]    CSN: [id number] Arrival date & time: 07/21/18  1357     History   Chief Complaint Chief Complaint  Patient presents with  . requesting tetanus shot    HPI Theodore Perez is a 38 y.o. male.   38 year old established patient presents to [facility] urgent care requesting a tetanus shot.  Patient had a laceration 04/09/16 and was given a Tdap. Tdap (BOOSTRIX) injection 0.5 mL (0.5 mLs Intramuscular Given 04/09/16 2335) lidocaine (PF) (XYLOCAINE) 1 % injection 5 mL (5 mLs Infiltration Given 04/10/16 0012)       Past Medical History:  Diagnosis Date  . Hypertension     Patient Active Problem List   Diagnosis Date Noted  . HTN (hypertension) 04/17/2014  . OCD (obsessive compulsive disorder) 04/17/2014  . Depression 04/17/2014    History reviewed. No pertinent surgical history.     Home Medications    Prior to Admission medications   Medication Sig Start Date End Date Taking? Authorizing Provider  doxycycline (VIBRA-TABS) 100 MG tablet Take 1 tablet (100 mg total) by mouth 2 (two) times daily. 07/21/18   Elvina Sidle, MD  mupirocin ointment (BACTROBAN) 2 % Apply 1 application topically 3 (three) times daily. 07/21/18   Elvina Sidle, MD  sertraline (ZOLOFT) 100 MG tablet Take 100 mg by mouth at bedtime.  08/18/13   [provider]    Family History No family history on file.  Social History Social History   Tobacco Use  . Smoking status: Never Smoker  . Smokeless tobacco: Never Used  Substance Use Topics  . Alcohol use: No    Comment: occasionally  . Drug use: No     Allergies   Penicillins   Review of Systems Review of Systems   Physical Exam Triage Vital Signs ED Triage Vitals  Enc Vitals Group     BP 07/21/18 1424 (!) 148/101     Pulse Rate 07/21/18 1424 71     Resp 07/21/18 1424 20     Temp 07/21/18 1424 97.8 F (36.6 C)     Temp Source 07/21/18 1424 Oral     SpO2 07/21/18 1424 97 %     Weight --       Height --      Head Circumference --      Peak Flow --      Pain Score 07/21/18 1425 0     Pain Loc --      Pain Edu? --      Excl. in GC? --    No data found.  Updated Vital Signs BP (!) 148/101 (BP Location: Right Arm)   Pulse 71   Temp 97.8 F (36.6 C) (Oral)   Resp 20   SpO2 97%   Physical Exam Vitals signs and nursing note reviewed.  Constitutional:      Appearance: Normal appearance.  HENT:     Head: Normocephalic.     Mouth/Throat:     Mouth: Mucous membranes are moist.  Neck:     Musculoskeletal: Normal range of motion.  Pulmonary:     Effort: Pulmonary effort is normal.  Musculoskeletal: Normal range of motion.  Skin:    General: Skin is warm and dry.     Comments: Tender right great toe, plantar aspect  Neurological:     General: No focal deficit present.     Mental Status: He is alert.      UC Treatments / Results  Labs (all labs  ordered are listed, but only abnormal results are displayed) Labs Reviewed - No data to display  EKG None  Radiology No results found.  Procedures Procedures (including critical care time)  Medications Ordered in UC Medications - No data to display  Initial Impression / Assessment and Plan / UC Course  I have reviewed the triage vital signs and the nursing notes.  Pertinent labs & imaging results that were available during my care of the patient were reviewed by me and considered in my medical decision making (see chart for details).    Final Clinical Impressions(s) / UC Diagnoses   Final diagnoses:  Exposure to environmental toxic substances     Discharge Instructions     Wash the foot in soap and water twice a day.  Apply the strong antibiotic cream to the foot three times a day.    ED Prescriptions    Medication Sig Dispense Auth. Provider   mupirocin ointment (BACTROBAN) 2 % Apply 1 application topically 3 (three) times daily. 22 g Elvina Sidle, MD   doxycycline (VIBRA-TABS) 100 MG  tablet Take 1 tablet (100 mg total) by mouth 2 (two) times daily. 10 tablet Elvina Sidle, MD     Controlled Substance Prescriptions [place] Controlled Substance Registry consulted? Not Applicable   Elvina Sidle, MD 07/21/18 [phone]

## 2018-07-21 NOTE — ED Triage Notes (Signed)
Pt requesting tetanus shot, states "can I have it in my toe", denies injury

## 2018-07-21 NOTE — Discharge Instructions (Addendum)
Wash the foot in soap and water twice a day.  Apply the strong antibiotic cream to the foot three times a day.

## 2018-10-11 DIAGNOSIS — F70 Mild intellectual disabilities: Secondary | ICD-10-CM | POA: Diagnosis not present

## 2018-10-11 DIAGNOSIS — F422 Mixed obsessional thoughts and acts: Secondary | ICD-10-CM | POA: Diagnosis not present

## 2018-10-11 DIAGNOSIS — F6381 Intermittent explosive disorder: Secondary | ICD-10-CM | POA: Diagnosis not present

## 2019-01-03 DIAGNOSIS — F422 Mixed obsessional thoughts and acts: Secondary | ICD-10-CM | POA: Diagnosis not present

## 2019-01-03 DIAGNOSIS — F6381 Intermittent explosive disorder: Secondary | ICD-10-CM | POA: Diagnosis not present

## 2019-01-03 DIAGNOSIS — F70 Mild intellectual disabilities: Secondary | ICD-10-CM | POA: Diagnosis not present

## 2019-04-04 DIAGNOSIS — F70 Mild intellectual disabilities: Secondary | ICD-10-CM | POA: Diagnosis not present

## 2019-04-04 DIAGNOSIS — F422 Mixed obsessional thoughts and acts: Secondary | ICD-10-CM | POA: Diagnosis not present

## 2019-04-04 DIAGNOSIS — F6381 Intermittent explosive disorder: Secondary | ICD-10-CM | POA: Diagnosis not present

## 2019-04-30 ENCOUNTER — Other Ambulatory Visit: Payer: Self-pay

## 2019-04-30 DIAGNOSIS — Z20822 Contact with and (suspected) exposure to covid-19: Secondary | ICD-10-CM

## 2019-05-02 LAB — NOVEL CORONAVIRUS, NAA: SARS-CoV-2, NAA: NOT DETECTED

## 2019-09-17 DIAGNOSIS — F422 Mixed obsessional thoughts and acts: Secondary | ICD-10-CM | POA: Diagnosis not present

## 2019-09-17 DIAGNOSIS — F70 Mild intellectual disabilities: Secondary | ICD-10-CM | POA: Diagnosis not present

## 2019-09-17 DIAGNOSIS — F6381 Intermittent explosive disorder: Secondary | ICD-10-CM | POA: Diagnosis not present

## 2019-12-11 DIAGNOSIS — F70 Mild intellectual disabilities: Secondary | ICD-10-CM | POA: Diagnosis not present

## 2019-12-11 DIAGNOSIS — F422 Mixed obsessional thoughts and acts: Secondary | ICD-10-CM | POA: Diagnosis not present

## 2019-12-11 DIAGNOSIS — F6381 Intermittent explosive disorder: Secondary | ICD-10-CM | POA: Diagnosis not present

## 2020-03-03 DIAGNOSIS — F6381 Intermittent explosive disorder: Secondary | ICD-10-CM | POA: Diagnosis not present

## 2020-03-03 DIAGNOSIS — F422 Mixed obsessional thoughts and acts: Secondary | ICD-10-CM | POA: Diagnosis not present

## 2020-03-03 DIAGNOSIS — F70 Mild intellectual disabilities: Secondary | ICD-10-CM | POA: Diagnosis not present

## 2020-05-12 DIAGNOSIS — F422 Mixed obsessional thoughts and acts: Secondary | ICD-10-CM | POA: Diagnosis not present

## 2020-05-12 DIAGNOSIS — F6381 Intermittent explosive disorder: Secondary | ICD-10-CM | POA: Diagnosis not present

## 2020-05-12 DIAGNOSIS — F70 Mild intellectual disabilities: Secondary | ICD-10-CM | POA: Diagnosis not present

## 2020-08-18 DIAGNOSIS — F6381 Intermittent explosive disorder: Secondary | ICD-10-CM | POA: Diagnosis not present

## 2020-08-18 DIAGNOSIS — F70 Mild intellectual disabilities: Secondary | ICD-10-CM | POA: Diagnosis not present

## 2020-08-18 DIAGNOSIS — F422 Mixed obsessional thoughts and acts: Secondary | ICD-10-CM | POA: Diagnosis not present

## 2020-10-29 ENCOUNTER — Encounter (HOSPITAL_COMMUNITY)
Admission: EM | Disposition: A | Discharge status: Home / Self Care | Payer: Self-pay | Source: Home / Self Care | Attending: Emergency Medicine

## 2020-10-29 ENCOUNTER — Emergency Department (HOSPITAL_COMMUNITY): Payer: Medicare Other | Admitting: Anesthesiology

## 2020-10-29 ENCOUNTER — Emergency Department (HOSPITAL_COMMUNITY): Payer: Medicare Other

## 2020-10-29 ENCOUNTER — Ambulatory Visit (HOSPITAL_COMMUNITY)
Admission: EM | Admit: 2020-10-29 | Discharge: 2020-10-29 | Disposition: A | Discharge status: Home / Self Care | Payer: Medicare Other | Attending: Emergency Medicine | Admitting: Emergency Medicine

## 2020-10-29 ENCOUNTER — Encounter (HOSPITAL_COMMUNITY): Payer: Self-pay | Admitting: Emergency Medicine

## 2020-10-29 DIAGNOSIS — I10 Essential (primary) hypertension: Secondary | ICD-10-CM | POA: Insufficient documentation

## 2020-10-29 DIAGNOSIS — Z20822 Contact with and (suspected) exposure to covid-19: Secondary | ICD-10-CM | POA: Diagnosis not present

## 2020-10-29 DIAGNOSIS — Z880 Allergy status to penicillin: Secondary | ICD-10-CM | POA: Insufficient documentation

## 2020-10-29 DIAGNOSIS — Z23 Encounter for immunization: Secondary | ICD-10-CM | POA: Insufficient documentation

## 2020-10-29 DIAGNOSIS — F429 Obsessive-compulsive disorder, unspecified: Secondary | ICD-10-CM | POA: Diagnosis not present

## 2020-10-29 DIAGNOSIS — S62634B Displaced fracture of distal phalanx of right ring finger, initial encounter for open fracture: Secondary | ICD-10-CM | POA: Insufficient documentation

## 2020-10-29 DIAGNOSIS — Z79899 Other long term (current) drug therapy: Secondary | ICD-10-CM | POA: Insufficient documentation

## 2020-10-29 DIAGNOSIS — S62634A Displaced fracture of distal phalanx of right ring finger, initial encounter for closed fracture: Secondary | ICD-10-CM | POA: Diagnosis not present

## 2020-10-29 DIAGNOSIS — F418 Other specified anxiety disorders: Secondary | ICD-10-CM | POA: Diagnosis not present

## 2020-10-29 HISTORY — PX: I & D EXTREMITY: SHX5045

## 2020-10-29 HISTORY — PX: PERCUTANEOUS PINNING: SHX2209

## 2020-10-29 LAB — SARS CORONAVIRUS 2 (TAT 6-24 HRS): SARS Coronavirus 2: NEGATIVE

## 2020-10-29 SURGERY — IRRIGATION AND DEBRIDEMENT EXTREMITY
Anesthesia: Monitor Anesthesia Care | Site: Finger | Laterality: Right

## 2020-10-29 SURGERY — IRRIGATION AND DEBRIDEMENT EXTREMITY
Anesthesia: Choice | Laterality: Right

## 2020-10-29 MED ORDER — ONDANSETRON HCL 4 MG/2ML IJ SOLN: 4.0000 mg | Freq: Once | INTRAMUSCULAR | Status: DC | PRN

## 2020-10-29 MED ORDER — SODIUM CHLORIDE (PF) 0.9 % IJ SOLN
INTRAMUSCULAR | Status: AC
  Filled 2020-10-29: qty 10

## 2020-10-29 MED ORDER — CHLORHEXIDINE GLUCONATE 0.12 % MT SOLN
OROMUCOSAL | Status: AC
  Filled 2020-10-29: qty 15

## 2020-10-29 MED ORDER — LIDOCAINE HCL 1 % IJ SOLN
INTRAMUSCULAR | Status: DC | PRN
  Administered 2020-10-29: 10 mL

## 2020-10-29 MED ORDER — ORAL CARE MOUTH RINSE: 15.0000 mL | Freq: Once | OROMUCOSAL | Status: DC

## 2020-10-29 MED ORDER — CHLORHEXIDINE GLUCONATE 4 % EX LIQD: 60.0000 mL | Freq: Once | CUTANEOUS | Status: DC

## 2020-10-29 MED ORDER — DIPHENHYDRAMINE HCL 50 MG/ML IJ SOLN
50.0000 mg | Freq: Once | INTRAMUSCULAR | Status: DC
  Filled 2020-10-29: qty 1

## 2020-10-29 MED ORDER — OXYCODONE HCL 5 MG/5ML PO SOLN: 5.0000 mg | Freq: Once | ORAL | Status: DC | PRN

## 2020-10-29 MED ORDER — TETANUS-DIPHTH-ACELL PERTUSSIS 5-2.5-18.5 LF-MCG/0.5 IM SUSY
0.5000 mL | PREFILLED_SYRINGE | Freq: Once | INTRAMUSCULAR | Status: AC
  Administered 2020-10-29: 0.5 mL via INTRAMUSCULAR
  Filled 2020-10-29: qty 0.5

## 2020-10-29 MED ORDER — FENTANYL CITRATE (PF) 250 MCG/5ML IJ SOLN
INTRAMUSCULAR | Status: AC
  Filled 2020-10-29: qty 5

## 2020-10-29 MED ORDER — VANCOMYCIN HCL IN DEXTROSE 1-5 GM/200ML-% IV SOLN: 1000.0000 mg | INTRAVENOUS | Status: DC

## 2020-10-29 MED ORDER — VANCOMYCIN HCL 1500 MG/300ML IV SOLN
1500.0000 mg | Freq: Once | INTRAVENOUS | Status: AC
  Administered 2020-10-29: 1500 mg via INTRAVENOUS
  Filled 2020-10-29: qty 300

## 2020-10-29 MED ORDER — LABETALOL HCL 5 MG/ML IV SOLN
INTRAVENOUS | Status: AC
  Administered 2020-10-29: 5 mg via INTRAVENOUS
  Filled 2020-10-29: qty 4

## 2020-10-29 MED ORDER — OXYCODONE-ACETAMINOPHEN 5-325 MG PO TABS
1.0000 | ORAL_TABLET | Freq: Once | ORAL | Status: AC
  Administered 2020-10-29: 1 via ORAL
  Filled 2020-10-29: qty 1

## 2020-10-29 MED ORDER — FENTANYL CITRATE (PF) 100 MCG/2ML IJ SOLN: 25.0000 ug | INTRAMUSCULAR | Status: DC | PRN

## 2020-10-29 MED ORDER — LIDOCAINE HCL 1 % IJ SOLN
INTRAMUSCULAR | Status: AC
  Filled 2020-10-29: qty 20

## 2020-10-29 MED ORDER — 0.9 % SODIUM CHLORIDE (POUR BTL) OPTIME
TOPICAL | Status: DC | PRN
  Administered 2020-10-29: 1000 mL

## 2020-10-29 MED ORDER — CEFAZOLIN SODIUM 1 G IJ SOLR
INTRAMUSCULAR | Status: AC
  Filled 2020-10-29: qty 20

## 2020-10-29 MED ORDER — CHLORHEXIDINE GLUCONATE 0.12 % MT SOLN: 15.0000 mL | Freq: Once | OROMUCOSAL | Status: DC

## 2020-10-29 MED ORDER — LABETALOL HCL 5 MG/ML IV SOLN
10.0000 mg | INTRAVENOUS | Status: AC | PRN
  Administered 2020-10-29: 5 mg via INTRAVENOUS

## 2020-10-29 MED ORDER — OXYCODONE HCL 5 MG PO TABS: 5.0000 mg | ORAL_TABLET | Freq: Once | ORAL | Status: DC | PRN

## 2020-10-29 MED ORDER — DEXAMETHASONE SODIUM PHOSPHATE 10 MG/ML IJ SOLN
INTRAMUSCULAR | Status: DC | PRN
  Administered 2020-10-29: 10 mg via INTRAVENOUS

## 2020-10-29 MED ORDER — PROPOFOL 10 MG/ML IV BOLUS
INTRAVENOUS | Status: AC
  Filled 2020-10-29: qty 20

## 2020-10-29 MED ORDER — CEFAZOLIN SODIUM-DEXTROSE 2-4 GM/100ML-% IV SOLN
2.0000 g | Freq: Once | INTRAVENOUS | Status: AC
  Administered 2020-10-29: 2 g via INTRAVENOUS
  Filled 2020-10-29: qty 100

## 2020-10-29 MED ORDER — BUPIVACAINE HCL (PF) 0.5 % IJ SOLN
20.0000 mL | Freq: Once | INTRAMUSCULAR | Status: AC
  Administered 2020-10-29: 20 mL
  Filled 2020-10-29: qty 20

## 2020-10-29 MED ORDER — FENTANYL CITRATE (PF) 100 MCG/2ML IJ SOLN
INTRAMUSCULAR | Status: DC | PRN
  Administered 2020-10-29: 50 ug via INTRAVENOUS

## 2020-10-29 MED ORDER — PROPOFOL 500 MG/50ML IV EMUL
INTRAVENOUS | Status: DC | PRN
  Administered 2020-10-29: 150 ug/kg/min via INTRAVENOUS

## 2020-10-29 MED ORDER — BUPIVACAINE HCL (PF) 0.25 % IJ SOLN
INTRAMUSCULAR | Status: AC
  Filled 2020-10-29: qty 30

## 2020-10-29 MED ORDER — MIDAZOLAM HCL 5 MG/5ML IJ SOLN
INTRAMUSCULAR | Status: DC | PRN
  Administered 2020-10-29: 2 mg via INTRAVENOUS

## 2020-10-29 MED ORDER — MIDAZOLAM HCL 2 MG/2ML IJ SOLN
INTRAMUSCULAR | Status: AC
  Filled 2020-10-29: qty 2

## 2020-10-29 MED ORDER — LACTATED RINGERS IV SOLN: INTRAVENOUS | Status: DC

## 2020-10-29 MED ORDER — ONDANSETRON HCL 4 MG/2ML IJ SOLN
INTRAMUSCULAR | Status: AC
  Filled 2020-10-29: qty 2

## 2020-10-29 MED ORDER — DEXAMETHASONE SODIUM PHOSPHATE 10 MG/ML IJ SOLN
INTRAMUSCULAR | Status: AC
  Filled 2020-10-29: qty 1

## 2020-10-29 MED ORDER — ONDANSETRON HCL 4 MG/2ML IJ SOLN
INTRAMUSCULAR | Status: DC | PRN
  Administered 2020-10-29: 4 mg via INTRAVENOUS

## 2020-10-29 MED ORDER — CEPHALEXIN 500 MG PO CAPS: 500.0000 mg | ORAL_CAPSULE | Freq: Four times a day (QID) | ORAL | 0 refills | Status: AC

## 2020-10-29 MED ORDER — POVIDONE-IODINE 10 % EX SWAB: 2.0000 "application " | Freq: Once | CUTANEOUS | Status: DC

## 2020-10-29 MED ORDER — LABETALOL HCL 5 MG/ML IV SOLN
5.0000 mg | INTRAVENOUS | Status: DC | PRN
  Administered 2020-10-29 (×2): 5 mg via INTRAVENOUS

## 2020-10-29 MED ORDER — OXYCODONE-ACETAMINOPHEN 10-325 MG PO TABS: 1.0000 | ORAL_TABLET | Freq: Four times a day (QID) | ORAL | 0 refills | Status: AC | PRN

## 2020-10-29 SURGICAL SUPPLY — 62 items
BNDG COHESIVE 1X5 TAN STRL LF (GAUZE/BANDAGES/DRESSINGS) ×2 IMPLANT
BNDG CONFORM 2 STRL LF (GAUZE/BANDAGES/DRESSINGS) ×2 IMPLANT
BNDG ELASTIC 3X5.8 VLCR STR LF (GAUZE/BANDAGES/DRESSINGS) IMPLANT
BNDG ELASTIC 4X5.8 VLCR STR LF (GAUZE/BANDAGES/DRESSINGS) IMPLANT
BNDG ESMARK 4X9 LF (GAUZE/BANDAGES/DRESSINGS) ×2 IMPLANT
BNDG GAUZE ELAST 4 BULKY (GAUZE/BANDAGES/DRESSINGS) IMPLANT
CORD BIPOLAR FORCEPS 12FT (ELECTRODE) ×2 IMPLANT
COVER SURGICAL LIGHT HANDLE (MISCELLANEOUS) ×2 IMPLANT
COVER WAND RF STERILE (DRAPES) ×2 IMPLANT
CUFF TOURN SGL QUICK 18X4 (TOURNIQUET CUFF) ×2 IMPLANT
DECANTER SPIKE VIAL GLASS SM (MISCELLANEOUS) ×2 IMPLANT
DRAPE C-ARM MINI 42X72 WSTRAPS (DRAPES) ×2 IMPLANT
DRAPE SURG 17X23 STRL (DRAPES) ×2 IMPLANT
DRSG ADAPTIC 3X8 NADH LF (GAUZE/BANDAGES/DRESSINGS) IMPLANT
DRSG EMULSION OIL 3X3 NADH (GAUZE/BANDAGES/DRESSINGS) ×2 IMPLANT
ELECT REM PT RETURN 9FT ADLT (ELECTROSURGICAL) ×2
ELECTRODE REM PT RTRN 9FT ADLT (ELECTROSURGICAL) ×1 IMPLANT
GAUZE SPONGE 2X2 8PLY STRL LF (GAUZE/BANDAGES/DRESSINGS) ×1 IMPLANT
GAUZE SPONGE 4X4 12PLY STRL (GAUZE/BANDAGES/DRESSINGS) IMPLANT
GAUZE XEROFORM 1X8 LF (GAUZE/BANDAGES/DRESSINGS) ×2 IMPLANT
GAUZE XEROFORM 5X9 LF (GAUZE/BANDAGES/DRESSINGS) IMPLANT
GLOVE BIOGEL PI IND STRL 8.5 (GLOVE) ×1 IMPLANT
GLOVE BIOGEL PI INDICATOR 8.5 (GLOVE) ×1
GLOVE SURG ORTHO 8.0 STRL STRW (GLOVE) ×2 IMPLANT
GOWN STRL REUS W/ TWL LRG LVL3 (GOWN DISPOSABLE) ×1 IMPLANT
GOWN STRL REUS W/ TWL XL LVL3 (GOWN DISPOSABLE) ×1 IMPLANT
GOWN STRL REUS W/TWL LRG LVL3 (GOWN DISPOSABLE) ×2
GOWN STRL REUS W/TWL XL LVL3 (GOWN DISPOSABLE) ×2
HANDPIECE INTERPULSE COAX TIP (DISPOSABLE)
K-WIRE DBL END TROCAR 6X.045 (WIRE) ×2
KIT BASIN OR (CUSTOM PROCEDURE TRAY) ×2 IMPLANT
KIT TURNOVER KIT B (KITS) ×2 IMPLANT
KWIRE DBL END TROCAR 6X.045 (WIRE) ×1 IMPLANT
MANIFOLD NEPTUNE II (INSTRUMENTS) ×2 IMPLANT
NEEDLE HYPO 25GX1X1/2 BEV (NEEDLE) ×2 IMPLANT
NS IRRIG 1000ML POUR BTL (IV SOLUTION) ×2 IMPLANT
PACK ORTHO EXTREMITY (CUSTOM PROCEDURE TRAY) ×2 IMPLANT
PAD ARMBOARD 7.5X6 YLW CONV (MISCELLANEOUS) ×4 IMPLANT
PAD CAST 4YDX4 CTTN HI CHSV (CAST SUPPLIES) IMPLANT
PADDING CAST COTTON 4X4 STRL (CAST SUPPLIES)
SET CYSTO W/LG BORE CLAMP LF (SET/KITS/TRAYS/PACK) IMPLANT
SET HNDPC FAN SPRY TIP SCT (DISPOSABLE) IMPLANT
SLING ARM FOAM STRAP LRG (SOFTGOODS) ×2 IMPLANT
SOAP 2 % CHG 4 OZ (WOUND CARE) ×2 IMPLANT
SPLINT FINGER 3.25 911903 (SOFTGOODS) ×2 IMPLANT
SPONGE GAUZE 2X2 STER 10/PKG (GAUZE/BANDAGES/DRESSINGS) ×1
SPONGE LAP 18X18 RF (DISPOSABLE) ×2 IMPLANT
SPONGE LAP 4X18 RFD (DISPOSABLE) IMPLANT
SUT CHROMIC 5 0 PS 3 (SUTURE) ×2 IMPLANT
SUT ETHILON 4 0 PS 2 18 (SUTURE) IMPLANT
SUT ETHILON 5 0 P 3 18 (SUTURE)
SUT NYLON ETHILON 5-0 P-3 1X18 (SUTURE) IMPLANT
SUT PROLENE 4 0 PS 2 18 (SUTURE) ×2 IMPLANT
SWAB COLLECTION DEVICE MRSA (MISCELLANEOUS) IMPLANT
SWAB CULTURE ESWAB REG 1ML (MISCELLANEOUS) IMPLANT
SYR CONTROL 10ML LL (SYRINGE) ×2 IMPLANT
TOWEL GREEN STERILE (TOWEL DISPOSABLE) ×2 IMPLANT
TOWEL GREEN STERILE FF (TOWEL DISPOSABLE) ×2 IMPLANT
TUBE CONNECTING 12X1/4 (SUCTIONS) ×2 IMPLANT
UNDERPAD 30X36 HEAVY ABSORB (UNDERPADS AND DIAPERS) ×2 IMPLANT
WATER STERILE IRR 1000ML POUR (IV SOLUTION) ×2 IMPLANT
YANKAUER SUCT BULB TIP NO VENT (SUCTIONS) ×2 IMPLANT

## 2020-10-29 NOTE — Anesthesia Postprocedure Evaluation (Addendum)
Anesthesia Post Note  Patient: Theodore Perez  Procedure(s) Performed: IRRIGATION AND DEBRIDEMENT RING FINGER (Right Finger) PINNING OF THE RIGHT RING FINGER (Right Finger)     Patient location during evaluation: PACU Anesthesia Type: Regional Level of consciousness: awake and alert Pain management: pain level controlled Vital Signs Assessment: post-procedure vital signs reviewed and stable Respiratory status: spontaneous breathing and respiratory function stable Cardiovascular status: stable Postop Assessment: no apparent nausea or vomiting Anesthetic complications: no Comments: Discussed his need for treatment for hypertension.  Pt voiced understanding.   No complications documented.  Last Vitals:  Vitals:   10/29/20 1945 10/29/20 1950  BP: (!) 167/122 (!) 167/122  Pulse:  68  Resp:  16  Temp:  (!) 36.2 C  SpO2:  97%    Last Pain:  Vitals:   10/29/20 1935  TempSrc:   PainSc: 0-No pain                 Jameir Ake DANIEL
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
## 2020-10-29 NOTE — Transfer of Care (Signed)
Immediate Anesthesia Transfer of Care Note  Patient: Theodore Perez  Procedure(s) Performed: IRRIGATION AND DEBRIDEMENT RING FINGER (Right Finger) PINNING OF THE RIGHT RING FINGER (Right Finger)  Patient Location: PACU  Anesthesia Type:MAC  Level of Consciousness: drowsy  Airway & Oxygen Therapy: Patient Spontanous Breathing  Post-op Assessment: Report given to RN and Post -op Vital signs reviewed and stable  Post vital signs: Reviewed and stable  Last Vitals:  Vitals Value Taken Time  BP 173/108 10/29/20 1832  Temp    Pulse 75 10/29/20 1833  Resp 18 10/29/20 1833  SpO2 100 % 10/29/20 1833  Vitals shown include unvalidated device data.  Last Pain:  Vitals:   10/29/20 0912  TempSrc: Oral  PainSc:          Complications: No complications documented.

## 2020-10-29 NOTE — Anesthesia Procedure Notes (Signed)
Procedure Name: MAC Date/Time: 10/29/2020 5:38 PM Performed by: Kyung Rudd, CRNA Pre-anesthesia Checklist: Patient identified, Emergency Drugs available, Suction available and Patient being monitored Patient Re-evaluated:Patient Re-evaluated prior to induction Oxygen Delivery Method: Simple face mask Preoxygenation: Pre-oxygenation with 100% oxygen Induction Type: IV induction Placement Confirmation: positive ETCO2 Dental Injury: Teeth and Oropharynx as per pre-operative assessment

## 2020-10-29 NOTE — ED Notes (Signed)
Consent for surgery obtained  

## 2020-10-29 NOTE — ED Provider Notes (Signed)
[facility] EMERGENCY DEPARTMENT Provider Note   CSN: [id number] Arrival date & time: 10/29/20  5374     History No chief complaint on file.   Theodore Perez is a 40 y.o. male hypertension, CAD, depression.  Patient presents with chief complaint of injury to his right ring finger.  He reports that his hand got caught in a slammed door earlier this morning.  Patient complains of pain to right ring finger.  Patient rates pain 9/10 on the pain scale.  Patient denies any alleviating factors.  Patient reports pain is worse when he attempts to move his finger or touch.  Patient endorses weakness to his affected finger.  Patient denies any numbness or tingling sensation.    Patient is right hand dominant.  Patient unsure when his last tetanus shot was.  HPI     Past Medical History:  Diagnosis Date  . Hypertension     Patient Active Problem List   Diagnosis Date Noted  . HTN (hypertension) 04/17/2014  . OCD (obsessive compulsive disorder) 04/17/2014  . Depression 04/17/2014    History reviewed. No pertinent surgical history.     History reviewed. No pertinent family history.  Social History   Tobacco Use  . Smoking status: Never Smoker  . Smokeless tobacco: Never Used  Substance Use Topics  . Alcohol use: No    Comment: occasionally  . Drug use: No    Home Medications Prior to Admission medications   Medication Sig Start Date End Date Taking? Authorizing Provider  doxycycline (VIBRA-TABS) 100 MG tablet Take 1 tablet (100 mg total) by mouth 2 (two) times daily. 07/21/18   Elvina Sidle, MD  mupirocin ointment (BACTROBAN) 2 % Apply 1 application topically 3 (three) times daily. 07/21/18   Elvina Sidle, MD  sertraline (ZOLOFT) 100 MG tablet Take 100 mg by mouth at bedtime.  08/18/13   [provider]    Allergies    Penicillins  Review of Systems   Review of Systems  Musculoskeletal: Positive for arthralgias and myalgias.  Skin:  Positive for wound. Negative for color change, pallor and rash.  Neurological: Positive for weakness. Negative for numbness.    Physical Exam Updated Vital Signs BP (!) 168/121 (BP Location: Left Arm)   Pulse (!) 106   Temp 98.1 F (36.7 C) (Oral)   Resp 20   SpO2 98%   Physical Exam Vitals and nursing note reviewed.  Constitutional:      General: He is not in acute distress.    Appearance: He is not ill-appearing, toxic-appearing or diaphoretic.  HENT:     Head: Normocephalic.  Eyes:     General: No scleral icterus.       Right eye: No discharge.        Left eye: No discharge.  Cardiovascular:     Rate and Rhythm: Normal rate.     Pulses:          Radial pulses are 3+ on the right side.  Pulmonary:     Effort: Pulmonary effort is normal. No respiratory distress.     Breath sounds: No stridor.  Musculoskeletal:     Comments: Right ring finger: Laceration from ulnar side of finger across nail fold, damage to patient's nail fold and cuticle, subungual hematoma observed, patient unable to bend finger at DIP joint, patient endorses decreased ulnar and radial sensation, tenderness to palpation from DIP joint  +3 right radial pulse, full range of motion intact to all other digits  of right hand, cap refill less than 2 in all other digits of right hand, sensation intact to all other digits of right hand, patient has no other bony tenderness, tenderness, swelling, or injury noted to his right hand other than what is noted above.    Skin:    General: Skin is warm and dry.     Coloration: Skin is not jaundiced or pale.  Neurological:     General: No focal deficit present.     Mental Status: He is alert.  Psychiatric:        Behavior: Behavior is cooperative.        ED Results / Procedures / Treatments   Labs (all labs ordered are listed, but only abnormal results are displayed) Labs Reviewed - No data to display  EKG None  Radiology No results  found.  Procedures .Nerve Block  Date/Time: 10/29/2020 9:55 AM Performed by: Haskel Schroeder, PA-C Authorized by: Haskel Schroeder, PA-C   Consent:    Consent obtained:  Verbal   Consent given by:  Patient   Risks discussed:  Bleeding, allergic reaction, intravenous injection, nerve damage, unsuccessful block, pain, infection and swelling   Alternatives discussed:  No treatment Universal protocol:    Procedure explained and questions answered to patient or proxy's satisfaction: yes     Patient identity confirmed:  Verbally with patient and arm band Indications:    Indications:  Pain relief Location:    Body area:  Upper extremity   Upper extremity nerve:  Metacarpal   Laterality:  Right Pre-procedure details:    Skin preparation:  Povidone-iodine Procedure details:    Block needle gauge:  25 G   Anesthetic injected:  Bupivacaine 0.5% w/o epi Post-procedure details:    Outcome:  Anesthesia achieved   Procedure completion:  Tolerated well, no immediate complications     Medications Ordered in ED Medications - No data to display  ED Course  I have reviewed the triage vital signs and the nursing notes.  Pertinent labs & imaging results that were available during my care of the patient were reviewed by me and considered in my medical decision making (see chart for details).    MDM Rules/Calculators/A&P                          Alert 40 year old male no acute distress, nontoxic-appearing.  Patient presents with chief complaint of injury to right ring finger after having his hand slammed in a door.  Patient has wound to right ring finger as noted above, reports decreased sensation to affected finger on ulnar and radial aspects, patient unable to bend finger at DIP.  Patient unsure when his last tetanus shot was.  Will update patient's tetanus shot, give patient Percocet for pain management as well as perform digital block with bupivacaine.  Will obtain x-ray imaging  of right hand..  Patient was discussed with and evaluated by Dr. Effie Shy.  X-ray imaging showed communicated fracture of the distal phalanx of the right ring finger.   We will consult  Hand surgery due to patient's open fracture, decreased range of motion and sensation.  1115 spoke to PA Earney Hamburg with hand surgery team.  Advised he will see the patient for evaluation.    Patient will be started on antibiotics, surgical plan is for him to go to the operating room today for irrigation, drainage, and fixation of right ring finger.  Patient is expected to be discharged after  her surgery  1200 patient expressed desire to leave the hospital and just come back for his surgery tomorrow.  Explained to patient that he needed to remain in the hospital to have IV antibiotics prior to his surgery.  Explained to patient that without IV antibiotics and surgery developing infection and possibly lose his finger and/or have detrimental effects to his right hand.  Patient expressed understanding of this.  Patient is agreeable to stay at this time.  Final Clinical Impression(s) / ED Diagnoses Final diagnoses:  Open displaced fracture of distal phalanx of right ring finger, initial encounter    Rx / DC Orders ED Discharge Orders    None       Berneice Heinrich 10/29/20 2222    Mancel Bale, MD 10/30/20 [phone]
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
## 2020-10-30 ENCOUNTER — Encounter (HOSPITAL_COMMUNITY): Payer: Self-pay | Admitting: Orthopedic Surgery

## 2020-11-10 DIAGNOSIS — F70 Mild intellectual disabilities: Secondary | ICD-10-CM | POA: Diagnosis not present

## 2020-11-10 DIAGNOSIS — F6381 Intermittent explosive disorder: Secondary | ICD-10-CM | POA: Diagnosis not present

## 2020-11-10 DIAGNOSIS — F422 Mixed obsessional thoughts and acts: Secondary | ICD-10-CM | POA: Diagnosis not present

## 2021-01-28 DIAGNOSIS — F422 Mixed obsessional thoughts and acts: Secondary | ICD-10-CM | POA: Diagnosis not present

## 2021-01-28 DIAGNOSIS — F6381 Intermittent explosive disorder: Secondary | ICD-10-CM | POA: Diagnosis not present

## 2021-01-28 DIAGNOSIS — F70 Mild intellectual disabilities: Secondary | ICD-10-CM | POA: Diagnosis not present

## 2021-10-21 DIAGNOSIS — F70 Mild intellectual disabilities: Secondary | ICD-10-CM | POA: Diagnosis not present

## 2021-10-21 DIAGNOSIS — F422 Mixed obsessional thoughts and acts: Secondary | ICD-10-CM | POA: Diagnosis not present

## 2021-10-21 DIAGNOSIS — F6381 Intermittent explosive disorder: Secondary | ICD-10-CM | POA: Diagnosis not present

## 2021-11-26 IMAGING — DX DG HAND COMPLETE 3+V*R*
3 series · 3 of 3 positions shown · non-contrast
Comparison: 12/21/2007

CLINICAL DATA: Slammed finger and door.

EXAM:
RIGHT HAND - COMPLETE 3+ VIEW

[hand pa]
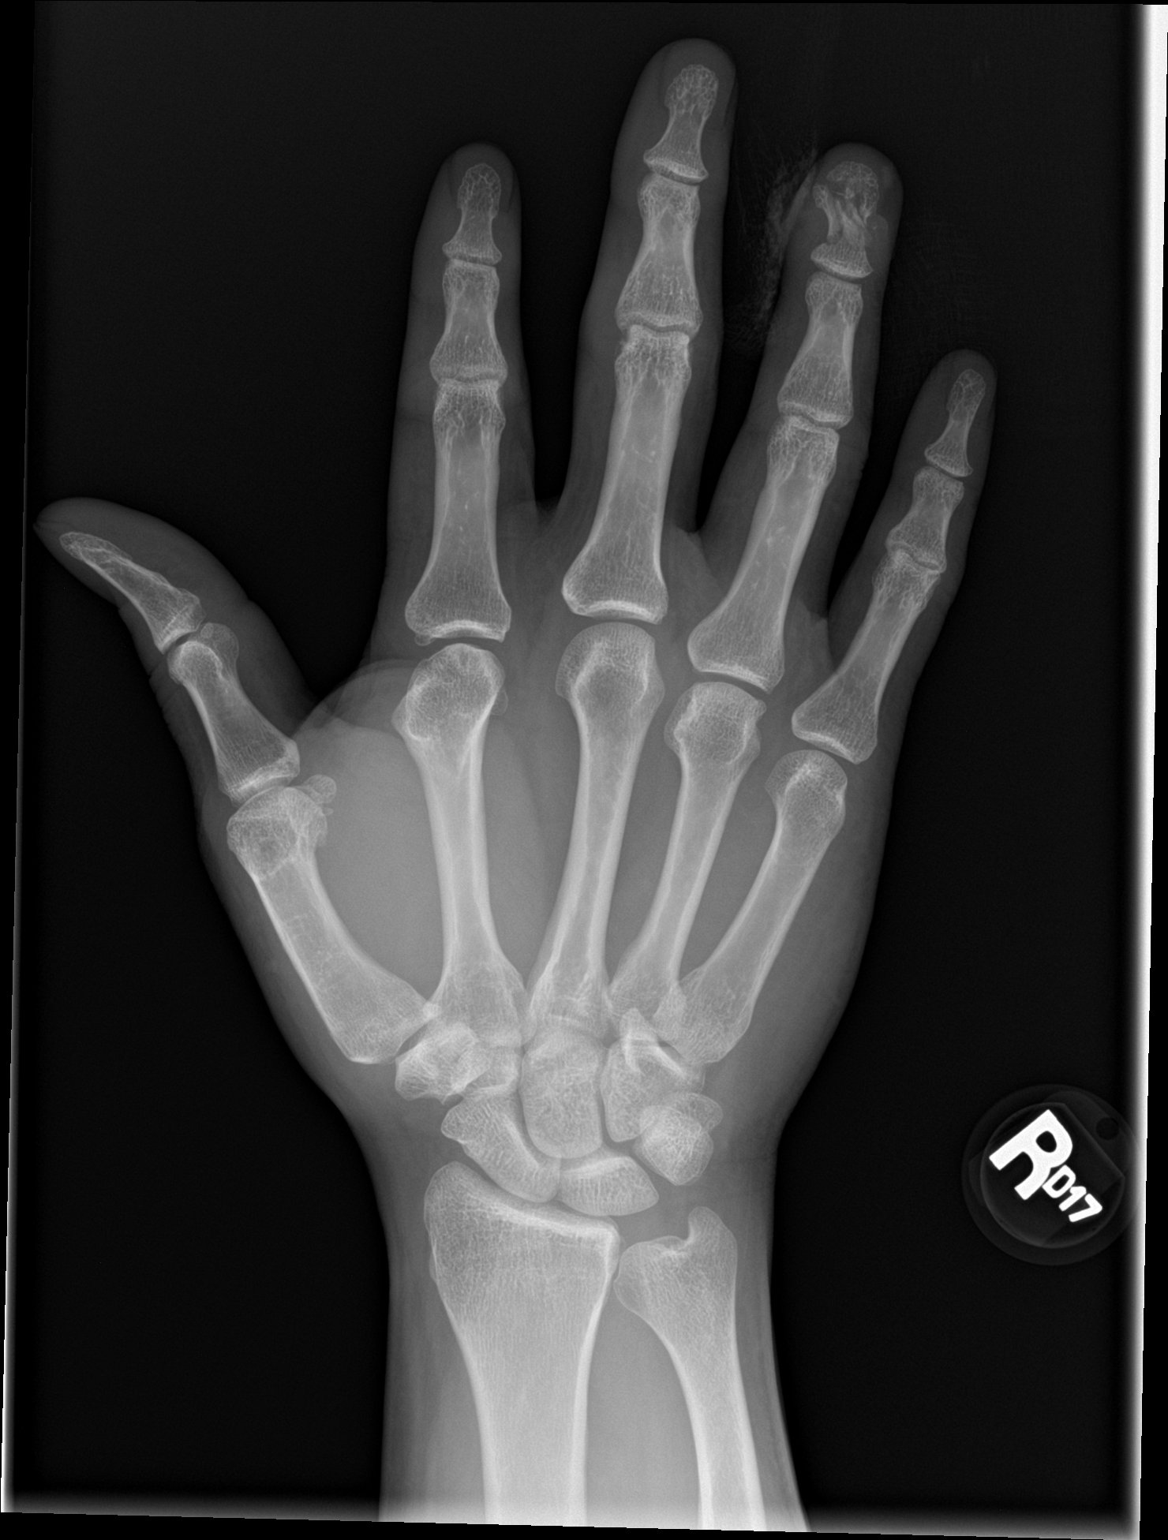

[hand obl]
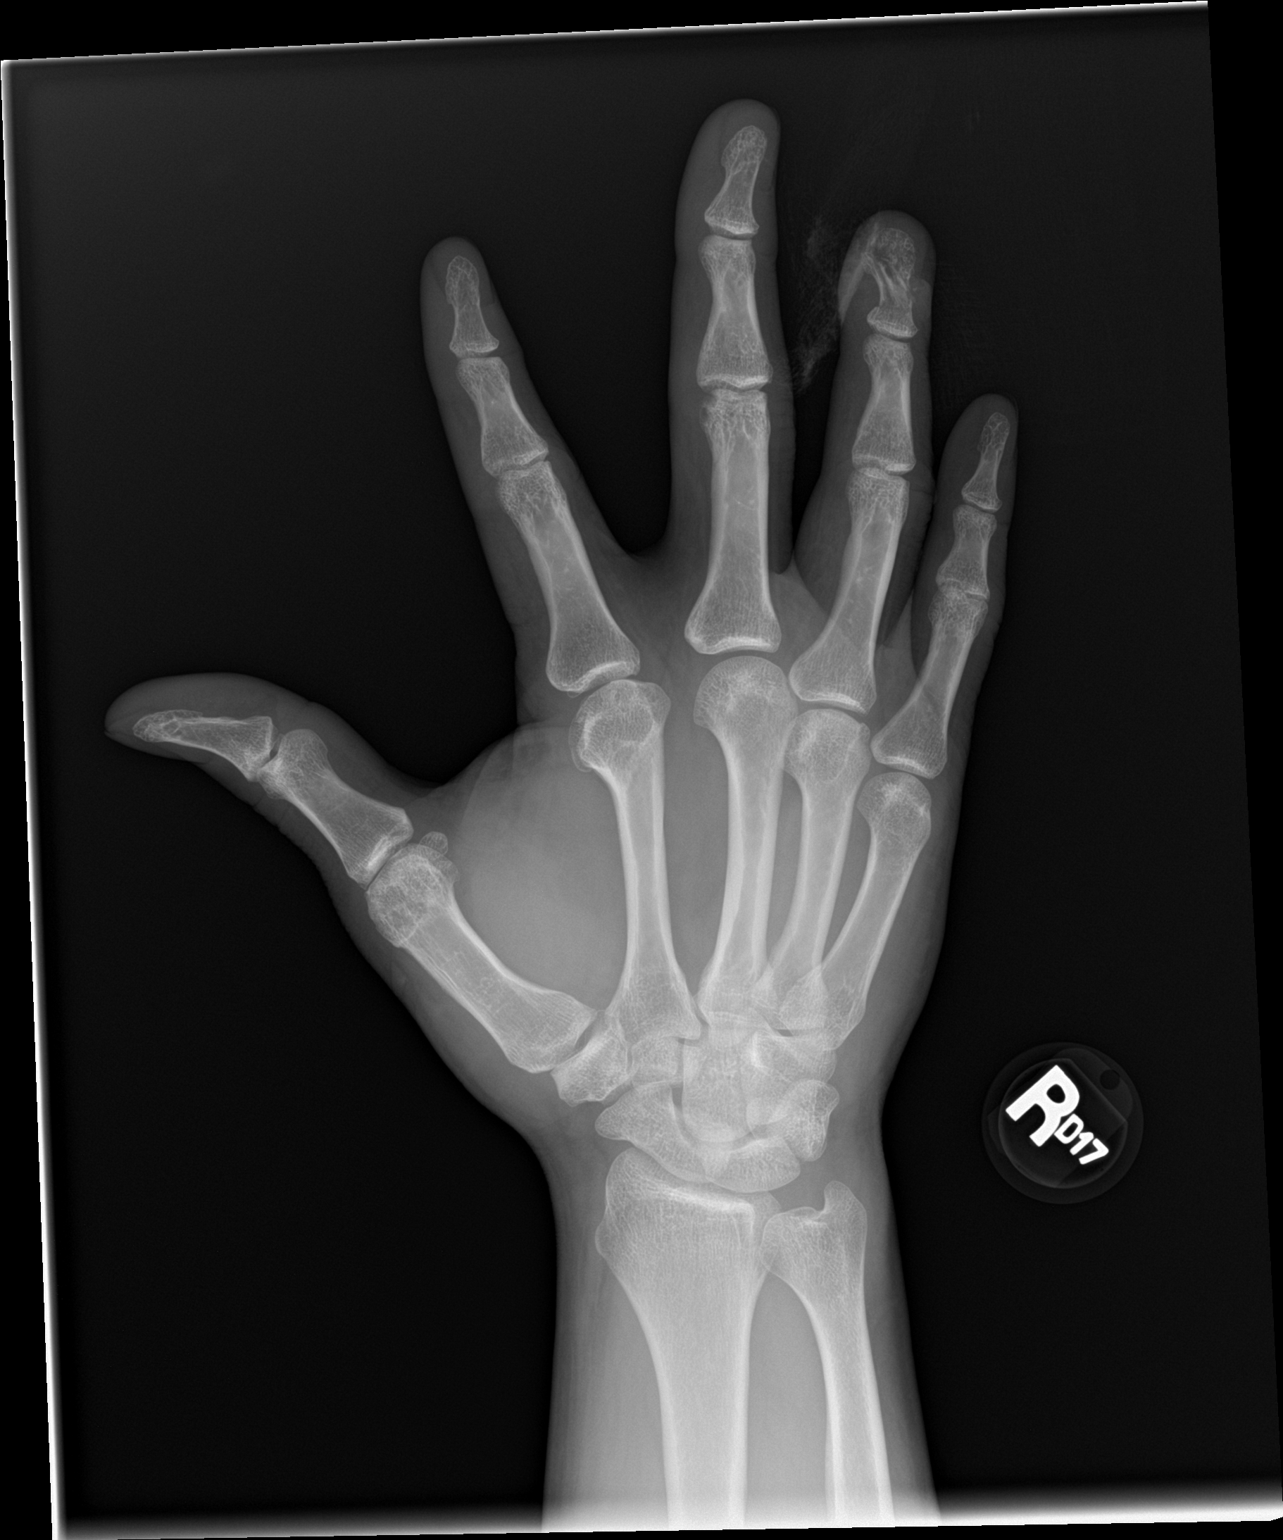

[hand lat]
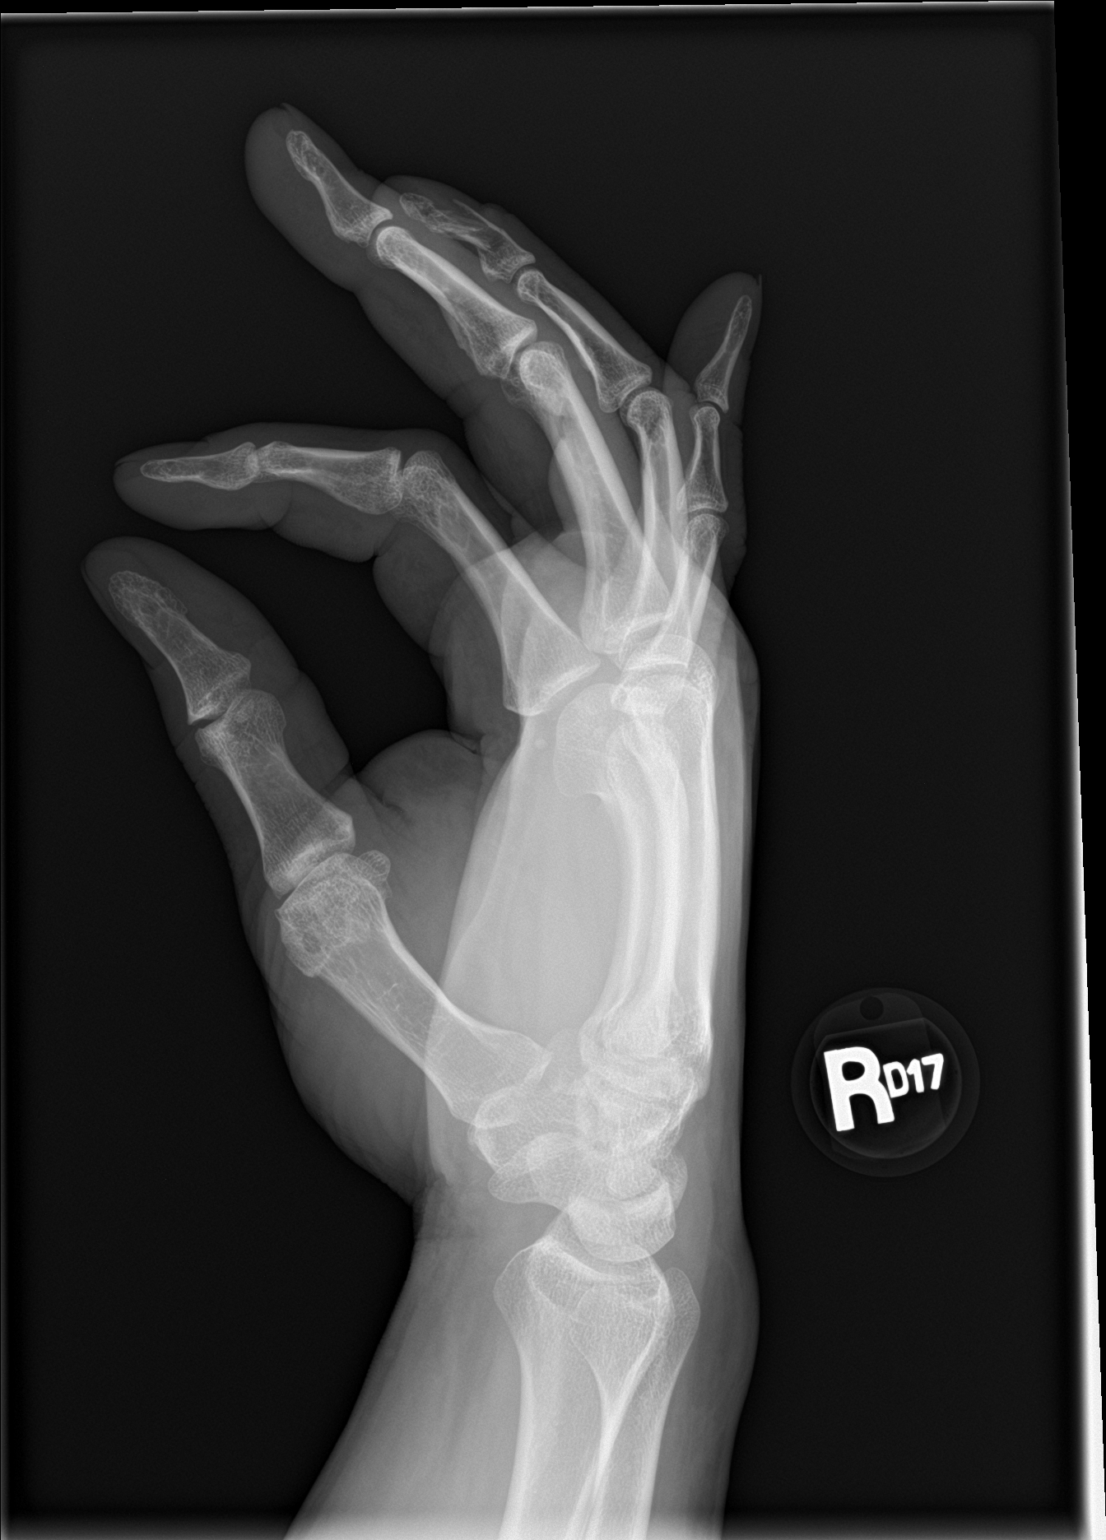

[3 of 3 positions shown; findings below may reference images not displayed]

FINDINGS: Complex comminuted fracture of the distal phalanx of the ring
finger. No involvement of the distal interphalangeal joint.

No other fractures are identified.  No radiopaque foreign body.
IMPRESSION: Comminuted fracture of the distal phalanx of the ring finger.

## 2021-12-30 DIAGNOSIS — F6381 Intermittent explosive disorder: Secondary | ICD-10-CM | POA: Diagnosis not present

## 2021-12-30 DIAGNOSIS — F422 Mixed obsessional thoughts and acts: Secondary | ICD-10-CM | POA: Diagnosis not present

## 2021-12-30 DIAGNOSIS — F70 Mild intellectual disabilities: Secondary | ICD-10-CM | POA: Diagnosis not present

## 2022-02-19 DIAGNOSIS — F422 Mixed obsessional thoughts and acts: Secondary | ICD-10-CM | POA: Diagnosis not present

## 2022-02-19 DIAGNOSIS — F70 Mild intellectual disabilities: Secondary | ICD-10-CM | POA: Diagnosis not present

## 2022-02-19 DIAGNOSIS — F6381 Intermittent explosive disorder: Secondary | ICD-10-CM | POA: Diagnosis not present

## 2022-03-17 ENCOUNTER — Ambulatory Visit (HOSPITAL_COMMUNITY)
Admission: EM | Admit: 2022-03-17 | Discharge: 2022-03-17 | Disposition: A | Discharge status: Home / Self Care | Payer: Medicare Other | Attending: Emergency Medicine | Admitting: Emergency Medicine

## 2022-03-17 ENCOUNTER — Encounter (HOSPITAL_COMMUNITY): Payer: Self-pay | Admitting: Emergency Medicine

## 2022-03-17 DIAGNOSIS — Z202 Contact with and (suspected) exposure to infections with a predominantly sexual mode of transmission: Secondary | ICD-10-CM | POA: Insufficient documentation

## 2022-03-17 NOTE — ED Provider Notes (Signed)
[facility]    CSN: [id number] Arrival date & time: 03/17/22  1637    HISTORY   Chief Complaint  Patient presents with   Exposure to STD   HPI Theodore Perez is a pleasant, 41 y.o. male who presents to urgent care today. Patient reports possible exposure to STD, states his girlfriend advised him that he needs to get himself checked.  Patient denies penile discharge, burning with urination, perineal pain, testicular pain or swelling, scrotal tenderness or swelling, fever, genital lesions.  The history is provided by the patient.   Past Medical History:  Diagnosis Date   Hypertension    Patient Active Problem List   Diagnosis Date Noted   HTN (hypertension) 04/17/2014   OCD (obsessive compulsive disorder) 04/17/2014   Depression 04/17/2014   Past Surgical History:  Procedure Laterality Date   I & D EXTREMITY Right 10/29/2020   Procedure: IRRIGATION AND DEBRIDEMENT RING FINGER;  Surgeon: Bradly Bienenstock, MD;  Location: [facility] OR;  Service: Orthopedics;  Laterality: Right;   PERCUTANEOUS PINNING Right 10/29/2020   Procedure: PINNING OF THE RIGHT RING FINGER;  Surgeon: Bradly Bienenstock, MD;  Location: [facility] OR;  Service: Orthopedics;  Laterality: Right;    Home Medications    Prior to Admission medications   Not on File    Family History History reviewed. No pertinent family history. Social History Social History   Tobacco Use   Smoking status: Never   Smokeless tobacco: Never  Substance Use Topics   Alcohol use: No    Comment: occasionally   Drug use: No   Allergies   Penicillins  Review of Systems Review of Systems Pertinent findings revealed after performing a 14 point review of systems has been noted in the history of present illness.  Physical Exam Triage Vital Signs ED Triage Vitals  Enc Vitals Group     BP 05/08/21 0827 (!) 147/82     Pulse Rate 05/08/21 0827 72     Resp 05/08/21 0827 18     Temp 05/08/21 0827 98.3 F (36.8 C)     Temp Source  05/08/21 0827 Oral     SpO2 05/08/21 0827 98 %     Weight --      Height --      Head Circumference --      Peak Flow --      Pain Score 05/08/21 0826 5     Pain Loc --      Pain Edu? --      Excl. in GC? --   No data found.  Updated Vital Signs BP (!) 179/109 (BP Location: Left Arm)   Pulse 78   Temp 98.3 F (36.8 C) (Oral)   Resp 18   SpO2 99%   Physical Exam Vitals and nursing note reviewed.  Constitutional:      General: He is not in acute distress.    Appearance: Normal appearance. He is not ill-appearing.  HENT:     Head: Normocephalic and atraumatic.  Eyes:     General: Lids are normal.        Right eye: No discharge.        Left eye: No discharge.     Extraocular Movements: Extraocular movements intact.     Conjunctiva/sclera: Conjunctivae normal.     Right eye: Right conjunctiva is not injected.     Left eye: Left conjunctiva is not injected.  Neck:     Trachea: Trachea and phonation normal.  Cardiovascular:  Rate and Rhythm: Normal rate and regular rhythm.     Pulses: Normal pulses.     Heart sounds: Normal heart sounds. No murmur heard.    No friction rub. No gallop.  Pulmonary:     Effort: Pulmonary effort is normal. No accessory muscle usage, prolonged expiration or respiratory distress.     Breath sounds: Normal breath sounds. No stridor, decreased air movement or transmitted upper airway sounds. No decreased breath sounds, wheezing, rhonchi or rales.  Chest:     Chest wall: No tenderness.  Genitourinary:    Comments: Pt politely declines GU exam, pt did provide a penile swab for testing.   Musculoskeletal:        General: Normal range of motion.     Cervical back: Normal range of motion and neck supple. Normal range of motion.  Lymphadenopathy:     Cervical: No cervical adenopathy.  Skin:    General: Skin is warm and dry.     Findings: No erythema or rash.  Neurological:     General: No focal deficit present.     Mental Status: He is alert  and oriented to person, place, and time.  Psychiatric:        Mood and Affect: Mood normal.        Behavior: Behavior normal.     Visual Acuity Right Eye Distance:   Left Eye Distance:   Bilateral Distance:    Right Eye Near:   Left Eye Near:    Bilateral Near:     UC Couse / Diagnostics / Procedures:     Radiology No results found.  Procedures Procedures (including critical care time) EKG  Pending results:  Labs Reviewed  CYTOLOGY, (ORAL, ANAL, URETHRAL) ANCILLARY ONLY    Medications Ordered in UC: Medications - No data to display  UC Diagnoses / Final Clinical Impressions(s)   I have reviewed the triage vital signs and the nursing notes.  Pertinent labs & imaging results that were available during my care of the patient were reviewed by me and considered in my medical decision making (see chart for details).    Final diagnoses:  Possible exposure to STD    STD screening was performed, patient advised that the results be posted to their MyChart and if any of the results are positive, they will be notified by phone, further treatment will be provided as indicated based on results of STD screening. Patient was advised to abstain from sexual intercourse until that they receive the results of their STD testing.  Patient was also advised to use condoms to protect themselves from STD exposure. Return precautions advised.  Drug allergies reviewed, all questions addressed.     ED Prescriptions   None    PDMP not reviewed this encounter.  Disposition Upon Discharge:  Condition: stable for discharge home  Patient presented with concern for an acute illness with associated systemic symptoms and significant discomfort requiring urgent management. In my opinion, this is a condition that a prudent lay person (someone who possesses an average knowledge of health and medicine) may potentially expect to result in complications if not addressed urgently such as respiratory  distress, impairment of bodily function or dysfunction of bodily organs.   As such, the patient has been evaluated and assessed, work-up was performed and treatment was provided in alignment with urgent care protocols and evidence based medicine.  Patient/parent/caregiver has been advised that the patient may require follow up for further testing and/or treatment if the symptoms continue in  spite of treatment, as clinically indicated and appropriate.  Routine symptom specific, illness specific and/or disease specific instructions were discussed with the patient and/or caregiver at length.  Prevention strategies for avoiding STD exposure were also discussed.  The patient will follow up with their current PCP if and as advised. If the patient does not currently have a PCP we will assist them in obtaining one.   The patient may need specialty follow up if the symptoms continue, in spite of conservative treatment and management, for further workup, evaluation, consultation and treatment as clinically indicated and appropriate.  Patient/parent/caregiver verbalized understanding and agreement of plan as discussed.  All questions were addressed during visit.  Please see discharge instructions below for further details of plan.  Discharge Instructions:   Discharge Instructions      The results of your STD testing today which screens for gonorrhea, chlamydia, and trichomonas will be made available to you once it is complete.  This typically takes 3 to 5 days.  Please note that we do not test for herpes virus unless you are having an active lesion concerning for herpes outbreak.  Please abstain from sexual intercourse of any kind, vaginal, oral or anal, until you have received the results of your STD testing.     Thank you for visiting urgent care today.  I appreciate the opportunity to participate in your care.       This office note has been dictated using Teaching laboratory technician.   Unfortunately, this method of dictation can sometimes lead to typographical or grammatical errors.  I apologize for your inconvenience in advance if this occurs.  Please do not hesitate to reach out to me if clarification is needed.       Theadora Rama Scales, PA-C 03/17/22 1801

## 2022-03-17 NOTE — Discharge Instructions (Signed)
The results of your STD testing today which screens for gonorrhea, chlamydia, and trichomonas will be made available to you once it is complete.  This typically takes 3 to 5 days.  Please note that we do not test for herpes virus unless you are having an active lesion concerning for herpes outbreak.  Please abstain from sexual intercourse of any kind, vaginal, oral or anal, until you have received the results of your STD testing.     Thank you for visiting urgent care today.  I appreciate the opportunity to participate in your care.  

## 2022-03-17 NOTE — ED Triage Notes (Signed)
Pt presents to UC for STD testing. States he was told he need to get his self checked. Denies any current symptoms.

## 2022-03-18 ENCOUNTER — Ambulatory Visit (HOSPITAL_COMMUNITY)
Admission: EM | Admit: 2022-03-18 | Discharge: 2022-03-18 | Disposition: A | Discharge status: Home / Self Care | Payer: Medicare Other | Attending: Internal Medicine | Admitting: Internal Medicine

## 2022-03-18 DIAGNOSIS — Z202 Contact with and (suspected) exposure to infections with a predominantly sexual mode of transmission: Secondary | ICD-10-CM | POA: Diagnosis not present

## 2022-03-18 DIAGNOSIS — Z302 Encounter for sterilization: Secondary | ICD-10-CM | POA: Diagnosis present

## 2022-03-18 LAB — CYTOLOGY, (ORAL, ANAL, URETHRAL) ANCILLARY ONLY
Chlamydia: NEGATIVE
Comment: NEGATIVE
Comment: NEGATIVE
Comment: NORMAL
Neisseria Gonorrhea: NEGATIVE
Trichomonas: NEGATIVE

## 2022-03-18 LAB — HIV ANTIBODY (ROUTINE TESTING W REFLEX): HIV Screen 4th Generation wRfx: NONREACTIVE

## 2022-03-18 NOTE — ED Triage Notes (Signed)
Patient returns to clinic for HIV and RPR blood draw, states he wanted to include those in his workup from yesterday

## 2022-03-19 LAB — RPR: RPR Ser Ql: NONREACTIVE

## 2022-05-24 DIAGNOSIS — F6381 Intermittent explosive disorder: Secondary | ICD-10-CM | POA: Diagnosis not present

## 2022-05-24 DIAGNOSIS — F70 Mild intellectual disabilities: Secondary | ICD-10-CM | POA: Diagnosis not present

## 2022-05-24 DIAGNOSIS — F422 Mixed obsessional thoughts and acts: Secondary | ICD-10-CM | POA: Diagnosis not present

## 2022-09-07 DIAGNOSIS — F70 Mild intellectual disabilities: Secondary | ICD-10-CM | POA: Diagnosis not present

## 2022-09-07 DIAGNOSIS — F422 Mixed obsessional thoughts and acts: Secondary | ICD-10-CM | POA: Diagnosis not present

## 2022-09-07 DIAGNOSIS — F6381 Intermittent explosive disorder: Secondary | ICD-10-CM | POA: Diagnosis not present

## 2022-12-22 DIAGNOSIS — F6381 Intermittent explosive disorder: Secondary | ICD-10-CM | POA: Diagnosis not present

## 2022-12-22 DIAGNOSIS — F70 Mild intellectual disabilities: Secondary | ICD-10-CM | POA: Diagnosis not present

## 2022-12-22 DIAGNOSIS — F422 Mixed obsessional thoughts and acts: Secondary | ICD-10-CM | POA: Diagnosis not present

## 2023-02-24 DIAGNOSIS — F422 Mixed obsessional thoughts and acts: Secondary | ICD-10-CM | POA: Diagnosis not present

## 2023-02-24 DIAGNOSIS — F6381 Intermittent explosive disorder: Secondary | ICD-10-CM | POA: Diagnosis not present

## 2023-02-24 DIAGNOSIS — F70 Mild intellectual disabilities: Secondary | ICD-10-CM | POA: Diagnosis not present

## 2023-06-22 DIAGNOSIS — F70 Mild intellectual disabilities: Secondary | ICD-10-CM | POA: Diagnosis not present

## 2023-06-22 DIAGNOSIS — F6381 Intermittent explosive disorder: Secondary | ICD-10-CM | POA: Diagnosis not present

## 2023-06-22 DIAGNOSIS — F422 Mixed obsessional thoughts and acts: Secondary | ICD-10-CM | POA: Diagnosis not present

## 2023-10-06 DIAGNOSIS — F70 Mild intellectual disabilities: Secondary | ICD-10-CM | POA: Diagnosis not present

## 2023-10-06 DIAGNOSIS — F6381 Intermittent explosive disorder: Secondary | ICD-10-CM | POA: Diagnosis not present

## 2023-10-06 DIAGNOSIS — F422 Mixed obsessional thoughts and acts: Secondary | ICD-10-CM | POA: Diagnosis not present

## 2023-11-30 DIAGNOSIS — F6381 Intermittent explosive disorder: Secondary | ICD-10-CM | POA: Diagnosis not present

## 2023-11-30 DIAGNOSIS — F70 Mild intellectual disabilities: Secondary | ICD-10-CM | POA: Diagnosis not present

## 2023-11-30 DIAGNOSIS — F422 Mixed obsessional thoughts and acts: Secondary | ICD-10-CM | POA: Diagnosis not present

## 2024-02-18 ENCOUNTER — Encounter (HOSPITAL_COMMUNITY): Payer: Self-pay | Admitting: Emergency Medicine

## 2024-02-18 ENCOUNTER — Ambulatory Visit (HOSPITAL_COMMUNITY)
Admission: EM | Admit: 2024-02-18 | Discharge: 2024-02-18 | Disposition: A | Discharge status: Home / Self Care | Attending: Family Medicine | Admitting: Family Medicine

## 2024-02-18 DIAGNOSIS — I10 Essential (primary) hypertension: Secondary | ICD-10-CM

## 2024-02-18 MED ORDER — AMLODIPINE BESYLATE 5 MG PO TABS: 5.0000 mg | ORAL_TABLET | Freq: Every day | ORAL | 2 refills | Status: AC

## 2024-02-18 NOTE — ED Triage Notes (Signed)
 Pt c/o being hypertensive x's 1 month with numbness in left hand.  St's he use to be on HTN meds but not taking any now.

## 2024-02-22 NOTE — ED Provider Notes (Signed)
 [facility]   [id number] 02/18/24 Arrival Time: 1350  ASSESSMENT & PLAN:  1. Essential hypertension     Meds ordered this encounter  Medications   amLODipine  (NORVASC ) 5 MG tablet    Sig: Take 1 tablet (5 mg total) by mouth daily.    Dispense:  30 tablet    Refill:  2     Follow-up Information     Schedule an appointment as soon as possible for a visit  with [facility].   Contact information: [address] [place]  [phone]                Reviewed expectations re: course of current medical issues. Questions answered. Outlined signs and symptoms indicating need for more acute intervention. Patient verbalized understanding. After Visit Summary given.   SUBJECTIVE:  Theodore Perez is a 43 y.o. male who presents with concerns regarding increased blood pressures. Reports that he has been treated for hypertension in the past. No current tx. Occas tingling in my left hand. Desires to be on BP med. Otherwise feeling well.  Social History   Tobacco Use  Smoking Status Never  Smokeless Tobacco Never     OBJECTIVE:  Vitals:   02/18/24 1506  BP: (!) 144/108  Pulse: 64  Resp: 18  Temp: 98.6 F (37 C)  TempSrc: Oral  SpO2: 99%    General appearance: alert; no distress Eyes: PERRLA; EOMI HENT: normocephalic; atraumatic Neck: supple Lungs: clear to auscultation bilaterally Heart: regular rate and rhythm Abdomen: soft, non-tender; bowel sounds normal Extremities: no edema; symmetrical with no gross deformities Skin: warm and dry Psychological: alert and cooperative; normal mood and affect  ECG: Orders placed or performed during the hospital encounter of 09/22/12   EKG 12-Lead   EKG 12-Lead   EKG 12-Lead   EKG 12-Lead    Labs:  Labs Reviewed - No data to display  Imaging: No results found.  Allergies  Allergen Reactions   Penicillins Swelling    Has patient had a PCN reaction  causing immediate rash, facial/tongue/throat swelling, SOB or lightheadedness with hypotension:unsure Has patient had a PCN reaction causing severe rash involving mucus membranes or skin necrosis:unsure Has patient had a PCN reaction that required hospitalization:No Has patient had a PCN reaction occurring within the last 10 years:No If all of the above answers are NO, then may proceed with Cephalosporin use.  Childhood reaction.     Past Medical History:  Diagnosis Date   Hypertension    Social History   Socioeconomic History   Marital status: Single    Spouse name: Not on file   Number of children: Not on file   Years of education: Not on file   Highest education level: Not on file  Occupational History   Not on file  Tobacco Use   Smoking status: Never   Smokeless tobacco: Never  Substance and Sexual Activity   Alcohol use: No    Comment: occasionally   Drug use: No   Sexual activity: Not on file  Other Topics Concern   Not on file  Social History Narrative   Not on file   Social Drivers of Health   Financial Resource Strain: Not on file  Food Insecurity: Not on file  Transportation Needs: Not on file  Physical Activity: Not on file  Stress: Not on file  Social Connections: Not on file  Intimate Partner Violence: Not on file   No family history on  file. Past Surgical History:  Procedure Laterality Date   I & D EXTREMITY Right 10/29/2020   Procedure: IRRIGATION AND DEBRIDEMENT RING FINGER;  Surgeon: Shari Easter, MD;  Location: [facility] OR;  Service: Orthopedics;  Laterality: Right;   PERCUTANEOUS PINNING Right 10/29/2020   Procedure: PINNING OF THE RIGHT RING FINGER;  Surgeon: Shari Easter, MD;  Location: [facility] OR;  Service: Orthopedics;  Laterality: Right;       Rolinda Rogue, MD 02/22/24 [phone]

## 2024-03-23 DIAGNOSIS — F6381 Intermittent explosive disorder: Secondary | ICD-10-CM | POA: Diagnosis not present

## 2024-03-23 DIAGNOSIS — F70 Mild intellectual disabilities: Secondary | ICD-10-CM | POA: Diagnosis not present

## 2024-03-23 DIAGNOSIS — F422 Mixed obsessional thoughts and acts: Secondary | ICD-10-CM | POA: Diagnosis not present

## 2024-06-11 DIAGNOSIS — F6381 Intermittent explosive disorder: Secondary | ICD-10-CM | POA: Diagnosis not present

## 2024-06-11 DIAGNOSIS — F422 Mixed obsessional thoughts and acts: Secondary | ICD-10-CM | POA: Diagnosis not present

## 2024-06-11 DIAGNOSIS — F70 Mild intellectual disabilities: Secondary | ICD-10-CM | POA: Diagnosis not present
